# Patient Record
Sex: Female | Born: 1960
Health system: Southern US, Community
[De-identification: ages and names within clinical notes are randomized; demographics above are authoritative.]

## PROBLEM LIST (undated history)

## (undated) DIAGNOSIS — C801 Malignant (primary) neoplasm, unspecified: Secondary | ICD-10-CM

## (undated) DIAGNOSIS — G43909 Migraine, unspecified, not intractable, without status migrainosus: Secondary | ICD-10-CM

## (undated) DIAGNOSIS — C541 Malignant neoplasm of endometrium: Secondary | ICD-10-CM

## (undated) DIAGNOSIS — G4730 Sleep apnea, unspecified: Secondary | ICD-10-CM

## (undated) DIAGNOSIS — K219 Gastro-esophageal reflux disease without esophagitis: Secondary | ICD-10-CM

## (undated) HISTORY — DX: Sleep apnea, unspecified: G47.30

## (undated) HISTORY — PX: MYOMECTOMY: SHX85

## (undated) HISTORY — PX: ABDOMINAL HYSTERECTOMY: SHX81

## (undated) HISTORY — DX: Migraine, unspecified, not intractable, without status migrainosus: G43.909

## (undated) HISTORY — DX: Malignant neoplasm of endometrium: C54.1

## (undated) HISTORY — PX: INGUINAL HERNIA REPAIR: SUR1180

## (undated) HISTORY — PX: CERVICAL POLYPECTOMY: SHX88

---

## 2004-09-13 ENCOUNTER — Ambulatory Visit: Payer: Self-pay | Admitting: Surgery

## 2005-01-01 ENCOUNTER — Ambulatory Visit: Payer: Self-pay | Admitting: Surgery

## 2006-09-09 ENCOUNTER — Ambulatory Visit: Payer: Self-pay | Admitting: Internal Medicine

## 2006-12-18 ENCOUNTER — Ambulatory Visit: Payer: Self-pay | Admitting: Family Medicine

## 2009-03-20 ENCOUNTER — Ambulatory Visit: Payer: Self-pay

## 2009-03-22 ENCOUNTER — Ambulatory Visit: Payer: Self-pay

## 2010-05-16 ENCOUNTER — Ambulatory Visit: Payer: Self-pay | Admitting: Family Medicine

## 2010-06-05 ENCOUNTER — Ambulatory Visit: Payer: Self-pay | Admitting: Family Medicine

## 2011-08-06 ENCOUNTER — Emergency Department: Payer: Self-pay | Admitting: *Deleted

## 2011-08-06 LAB — COMPREHENSIVE METABOLIC PANEL
Albumin: 3.6 g/dL (ref 3.4–5.0)
Alkaline Phosphatase: 52 U/L (ref 50–136)
Anion Gap: 8 (ref 7–16)
BUN: 21 mg/dL — ABNORMAL HIGH (ref 7–18)
Calcium, Total: 8.6 mg/dL (ref 8.5–10.1)
Chloride: 105 mmol/L (ref 98–107)
Co2: 26 mmol/L (ref 21–32)
Creatinine: 0.82 mg/dL (ref 0.60–1.30)
EGFR (African American): >60
EGFR (Non-African Amer.): >60
Osmolality: 281 (ref 275–301)
Potassium: 3.7 mmol/L (ref 3.5–5.1)
SGOT(AST): 18 U/L (ref 15–37)
SGPT (ALT): 16 U/L
Sodium: 139 mmol/L (ref 136–145)
Total Protein: 6.9 g/dL (ref 6.4–8.2)

## 2011-08-06 LAB — CBC
HCT: 45 % (ref 35.0–47.0)
MCHC: 33.1 g/dL (ref 32.0–36.0)
MCV: 91 fL (ref 80–100)
Platelet: 232 10*3/uL (ref 150–440)
WBC: 7.7 10*3/uL (ref 3.6–11.0)

## 2011-08-06 LAB — PROTIME-INR: Prothrombin Time: 12.8 secs (ref 11.5–14.7)

## 2011-08-06 LAB — LIPASE, BLOOD: Lipase: 70 U/L — ABNORMAL LOW (ref 73–393)

## 2011-08-06 LAB — CK TOTAL AND CKMB (NOT AT ARMC): CK, Total: 38 U/L (ref 21–215)

## 2014-08-24 ENCOUNTER — Encounter: Payer: Self-pay | Admitting: Obstetrics and Gynecology

## 2014-09-06 ENCOUNTER — Encounter (INDEPENDENT_AMBULATORY_CARE_PROVIDER_SITE_OTHER): Payer: Self-pay

## 2014-09-06 ENCOUNTER — Inpatient Hospital Stay
Payer: Federal, State, Local not specified - PPO | Attending: Obstetrics and Gynecology | Admitting: Obstetrics and Gynecology

## 2014-09-06 VITALS — BP 129/89 | HR 75 | Temp 97.4°F | Resp 18 | Ht 67.0 in | Wt 203.5 lb

## 2014-09-06 DIAGNOSIS — N8502 Endometrial intraepithelial neoplasia [EIN]: Secondary | ICD-10-CM | POA: Insufficient documentation

## 2014-09-06 DIAGNOSIS — F17210 Nicotine dependence, cigarettes, uncomplicated: Secondary | ICD-10-CM

## 2014-09-06 DIAGNOSIS — G4730 Sleep apnea, unspecified: Secondary | ICD-10-CM

## 2014-09-06 NOTE — Progress Notes (Signed)
Gynecologic Oncology Consult Visit   Referring Provider: Dr Rivka Barbara  Chief Concern: Atypical endometrial hyperplasia, possible endometrial cancer  Subjective:  Grace Sanchez is a 54 y.o. G85P2 female who is seen in consultation from Dr. Laurey Morale for atypical endometrial hyperplasia, possible endometrial cancer.  Menopause 2 years ago.  2010 D&C for PMB showed endometrial polyps and proliferative endometrium.  PAP normal 01/10/14 PMB, PAP ASCUS with negative HPV, endocervical polyp removed that showed complex hyperplasia without atypia 01/24/14 endo bx showed inactive endometrium, benign endocervical polyp 08/17/14 endo bx showed atypical endometrial hyperplasia, EIN at least.  She is otherwise healthy and has no other complaints.  Problem List: Patient Active Problem List   Diagnosis Date Noted  . Endometrial intraepithelial neoplasia (EIN) 09/06/2014    Past Medical History: Past Medical History  Diagnosis Date  . Migraine   . Sleep apnea     Past Surgical History: Past Surgical History  Procedure Laterality Date  . Inguinal hernia repair    . Cervical polypectomy       Family History: Family History  Problem Relation Age of Onset  . Deep vein thrombosis Mother   . Heart attack Mother   . Diabetes Mother   . Diabetes Brother   . Diabetes Sister   . Diabetes Maternal Aunt     Social History: History   Social History  . Marital Status: Married    Spouse Name: N/A  . Number of Children: N/A  . Years of Education: N/A   Occupational History  . Not on file.   Social History Main Topics  . Smoking status: Light Tobacco Smoker -- 0.25 packs/day for 4 years    Types: Cigarettes  . Smokeless tobacco: Never Used  . Alcohol Use: 0.6 oz/week    1 Glasses of wine per week     Comment: glass a day  . Drug Use: No  . Sexual Activity: Not on file   Other Topics Concern  . Not on file   Social History Narrative  . No narrative on file     Allergies: Allergies  Allergen Reactions  . Codeine Other (See Comments)    Vasovagal response- hypotensive    Current Medications: No current outpatient prescriptions on file.   No current facility-administered medications for this visit.    Review of Systems General: negative for, fevers, chills, fatigue, changes in sleep, changes in weight or appetite Skin: negative for changes in color, texture, moles or lesions Eyes: negative for, changes in vision, pain, diplopia HEENT: negative for, change in hearing, pain, discharge, tinnitus, vertigo, voice changes, sore throat, neck masses Breasts: negative for breast lumps Pulmonary: negative for, dyspnea, orthopnea, productive cough Cardiac: negative for, palpitations, syncope, pain, discomfort, pressure Gastrointestinal: negative for, dysphagia, nausea, vomiting, jaundice, pain, constipation, diarrhea, hematemesis, hematochezia Genitourinary/Sexual: negative for, dysuria, discharge, hesitancy, nocturia, retention, stones, infections, STD's, incontinence Ob/Gyn: negative for pain Musculoskeletal: negative for, pain, stiffness, swelling, range of motion limitation Hematology: negative for, easy bruising, bleeding Neurologic/Psych: negative for, headaches, seizures, paralysis, weakness, tremor, change in gait, change in sensation, mood swings, depression, anxiety, change in memory  Objective:  Physical Examination:  Filed Vitals:   09/06/14 1408  BP: 129/89  Pulse: 75  Temp: 97.4 F (36.3 C)  TempSrc: Tympanic  Resp: 18  Height: 5\' 7"  (1.702 m)  Weight: 203 lb 7.8 oz (92.301 kg)    ECOG Performance Status: 0 - Asymptomatic  General appearance: alert, cooperative and appears stated age HEENT: membranes moist, PERRL, EOMI, sclera  clear Neck: supple,"no thyroid enlargement or cervical adenopathy Lymph node survey: normal Cardiovascular: regular rate and rhythm, without murmurs, rubs or gallops Respiratory: clear to  auscultation Abdomen: no palpable masses, no hernias, soft", nontender, nondistended, without hepatosplenomegaly Back: inspection of back is normal Extremities: no lower extremity edema Skin exam: normal coloration and turgor, no rashes, no suspicious skin lesions noted. Neurological exam reveals alert, oriented, normal speech, no focal findings or movement disorder noted.  Pelvic: Vulva: normal appearing vulva with no masses, tenderness or lesions; Vagina: normal vagina, no discharge, exudate, lesion, or erythema; Adnexa: normal adnexa in size, nontender and no masses; Uterus: normal size, shape, consistency and nontender; Cervix: no lesions; Bimanual: no masses or tenderness, Rectal: confirms bimanual   Assessment:  Grace Sanchez is a 54 y.o. female diagnosed with endometrial bx showing atypical endometrial hyperplasia, EIN at least.   Plan:   Problem List Items Addressed This Visit    Endometrial intraepithelial neoplasia (EIN) - Primary     We discussed options for management including TLH/BSO with sentinel lymph node mapping and biopsy in the event that cancer is present.  We will schedule the surgery for 09/27/14 with Dr Glennon Mac or Ilda Basset from [facility].  The risks of surgery were discussed in detail and she understands these to include infection; wound separation; hernia; vaginal cuff separation, injury to adjacent organs such as bowel, bladder, blood vessels, ureters and nerves; bleeding which may require blood transfusion; anesthesia risk; thromboembolic events; possible death; unforeseen complications; possible need for re-exploration; medical complications such as heart attack, stroke, pleural effusion and pneumonia; and, if staging performed the risk of lymphedema and lymphocyst.  The patient will receive DVT and antibiotic prophylaxis as indicated.  She voiced a clear understanding.  She had the opportunity to ask questions and written informed consent was obtained today.     The patient's diagnosis, an outline of the further diagnostic and laboratory studies which will be required, the recommendation, and alternatives were discussed.  All questions were answered to the patient's satisfaction.  Mellody Drown, MD  CC:  Rosina Lowenstein, MD [address] [place], [place] [address] [phone]

## 2014-09-11 ENCOUNTER — Telehealth: Payer: Self-pay | Admitting: *Deleted

## 2014-09-11 NOTE — Telephone Encounter (Signed)
Spoke with patient. Pre-admit testing apt given to patient. Apt on September 18, 2014 at 9 am. Instructed patient to report to [id number] in the [facility] for her preoperative appointment.  Patient's surgery case has been posted for 09/27/14 with Dr. Fransisca Connors and Dr. Glennon Mac.

## 2014-09-18 ENCOUNTER — Encounter
Admission: RE | Admit: 2014-09-18 | Discharge: 2014-09-18 | Disposition: A | Discharge status: Home / Self Care | Payer: Federal, State, Local not specified - PPO | Source: Ambulatory Visit | Attending: Obstetrics and Gynecology | Admitting: Obstetrics and Gynecology

## 2014-09-18 DIAGNOSIS — N8502 Endometrial intraepithelial neoplasia [EIN]: Secondary | ICD-10-CM | POA: Diagnosis not present

## 2014-09-18 DIAGNOSIS — Z01812 Encounter for preprocedural laboratory examination: Secondary | ICD-10-CM | POA: Insufficient documentation

## 2014-09-18 HISTORY — DX: Gastro-esophageal reflux disease without esophagitis: K21.9

## 2014-09-18 LAB — CBC WITH DIFFERENTIAL/PLATELET
BASOS ABS: 0.1 10*3/uL (ref 0–0.1)
Basophils Relative: 1 %
Eosinophils Absolute: 0.1 10*3/uL (ref 0–0.7)
Eosinophils Relative: 1 %
HCT: 45.5 % (ref 35.0–47.0)
Hemoglobin: 15.2 g/dL (ref 12.0–16.0)
LYMPHS PCT: 23 %
Lymphs Abs: 1.9 10*3/uL (ref 1.0–3.6)
MCH: 30.3 pg (ref 26.0–34.0)
MCHC: 33.4 g/dL (ref 32.0–36.0)
MCV: 90.5 fL (ref 80.0–100.0)
Monocytes Absolute: 0.5 10*3/uL (ref 0.2–0.9)
Monocytes Relative: 7 %
Neutro Abs: 5.6 10*3/uL (ref 1.4–6.5)
Neutrophils Relative %: 68 %
Platelets: 268 10*3/uL (ref 150–440)
RBC: 5.03 MIL/uL (ref 3.80–5.20)
RDW: 13.2 % (ref 11.5–14.5)
WBC: 8.2 10*3/uL (ref 3.6–11.0)

## 2014-09-18 LAB — BASIC METABOLIC PANEL
Anion gap: 6 (ref 5–15)
BUN: 20 mg/dL (ref 6–20)
CO2: 26 mmol/L (ref 22–32)
Calcium: 8.9 mg/dL (ref 8.9–10.3)
Chloride: 108 mmol/L (ref 101–111)
Creatinine, Ser: 0.84 mg/dL (ref 0.44–1.00)
GFR calc Af Amer: >60 mL/min (ref >60)
GFR calc non Af Amer: >60 mL/min (ref >60)
Glucose, Bld: 101 mg/dL — ABNORMAL HIGH (ref 65–99)
Potassium: 3.4 mmol/L — ABNORMAL LOW (ref 3.5–5.1)
Sodium: 140 mmol/L (ref 135–145)

## 2014-09-18 LAB — URINALYSIS COMPLETE WITH MICROSCOPIC (ARMC ONLY)
BACTERIA UA: NONE SEEN
BILIRUBIN URINE: NEGATIVE
GLUCOSE, UA: NEGATIVE mg/dL
Hgb urine dipstick: NEGATIVE
Ketones, ur: NEGATIVE mg/dL
LEUKOCYTES UA: NEGATIVE
NITRITE: NEGATIVE
Protein, ur: NEGATIVE mg/dL
Specific Gravity, Urine: 1.013 (ref 1.005–1.030)
Squamous Epithelial / LPF: NONE SEEN
pH: 5 (ref 5.0–8.0)

## 2014-09-18 LAB — ABO/RH: ABO/RH(D): A POS

## 2014-09-18 LAB — TYPE AND SCREEN
ABO/RH(D): A POS
ANTIBODY SCREEN: NEGATIVE

## 2014-09-18 LAB — APTT: aPTT: 28 seconds (ref 24–36)

## 2014-09-18 LAB — PROTIME-INR
INR: 0.9
PROTHROMBIN TIME: 12.4 s (ref 11.4–15.0)

## 2014-09-18 NOTE — Patient Instructions (Signed)
  Your procedure is scheduled GY:IRSW 22, 2016 Wednesday) Report to Day Surgery. To find out your arrival time please call [phone] between 1PM - 3PM on September 26, 2014 (Tuesday).  Remember: Instructions that are not followed completely may result in serious medical risk, up to and including death, or upon the discretion of your surgeon and anesthesiologist your surgery may need to be rescheduled.    __x__ 1. Do not eat food or drink liquids after midnight. No gum chewing or hard candies.     __x__ 2. No Alcohol for 24 hours before or after surgery.   ____ 3. Bring all medications with you on the day of surgery if instructed.    __x__ 4. Notify your doctor if there is any change in your medical condition     (cold, fever, infections).     Do not wear jewelry, make-up, hairpins, clips or nail polish.  Do not wear lotions, powders, or perfumes. You may wear deodorant.  Do not shave 48 hours prior to surgery. Men may shave face and neck.  Do not bring valuables to the hospital.    [facility] is not responsible for any belongings or valuables.               Contacts, dentures or bridgework may not be worn into surgery.  Leave your suitcase in the car. After surgery it may be brought to your room.  For patients admitted to the hospital, discharge time is determined by your                treatment team.   Patients discharged the day of surgery will not be allowed to drive home.   Please read over the following fact sheets that you were given:   Surgical Site Infection Prevention   ____ Take these medicines the morning of surgery with A SIP OF WATER:    1.   2.   3.   4.  5.  6.  __x__ Fleet Enema (as directed)   __x_ Use CHG Soap as directed  ____ Use inhalers on the day of surgery  ____ Stop metformin 2 days prior to surgery    ____ Take 1/2 of usual insulin dose the night before surgery and none on the morning of surgery.   ____ Stop Coumadin/Plavix/aspirin on    ____ Stop Anti-inflammatories on    ____ Stop supplements until after surgery.    ____ Bring C-Pap to the hospital.

## 2014-09-27 ENCOUNTER — Ambulatory Visit: Payer: Federal, State, Local not specified - PPO | Admitting: Anesthesiology

## 2014-09-27 ENCOUNTER — Encounter
Admission: RE | Disposition: A | Discharge status: Home / Self Care | Payer: Self-pay | Source: Ambulatory Visit | Attending: Obstetrics and Gynecology

## 2014-09-27 ENCOUNTER — Encounter: Payer: Self-pay | Admitting: *Deleted

## 2014-09-27 ENCOUNTER — Observation Stay
Admission: RE | Admit: 2014-09-27 | Discharge: 2014-09-28 | Disposition: A | Discharge status: Home / Self Care | Payer: Federal, State, Local not specified - PPO | Source: Ambulatory Visit | Attending: Obstetrics and Gynecology | Admitting: Obstetrics and Gynecology

## 2014-09-27 DIAGNOSIS — Z8249 Family history of ischemic heart disease and other diseases of the circulatory system: Secondary | ICD-10-CM | POA: Diagnosis not present

## 2014-09-27 DIAGNOSIS — N9489 Other specified conditions associated with female genital organs and menstrual cycle: Secondary | ICD-10-CM | POA: Insufficient documentation

## 2014-09-27 DIAGNOSIS — D398 Neoplasm of uncertain behavior of other specified female genital organs: Secondary | ICD-10-CM | POA: Insufficient documentation

## 2014-09-27 DIAGNOSIS — N838 Other noninflammatory disorders of ovary, fallopian tube and broad ligament: Secondary | ICD-10-CM | POA: Diagnosis not present

## 2014-09-27 DIAGNOSIS — N950 Postmenopausal bleeding: Secondary | ICD-10-CM | POA: Insufficient documentation

## 2014-09-27 DIAGNOSIS — C541 Malignant neoplasm of endometrium: Secondary | ICD-10-CM | POA: Diagnosis not present

## 2014-09-27 DIAGNOSIS — G4730 Sleep apnea, unspecified: Secondary | ICD-10-CM | POA: Diagnosis not present

## 2014-09-27 DIAGNOSIS — N8502 Endometrial intraepithelial neoplasia [EIN]: Secondary | ICD-10-CM | POA: Diagnosis present

## 2014-09-27 DIAGNOSIS — K219 Gastro-esophageal reflux disease without esophagitis: Secondary | ICD-10-CM | POA: Insufficient documentation

## 2014-09-27 DIAGNOSIS — Z833 Family history of diabetes mellitus: Secondary | ICD-10-CM | POA: Diagnosis not present

## 2014-09-27 DIAGNOSIS — N800 Endometriosis of uterus: Secondary | ICD-10-CM | POA: Insufficient documentation

## 2014-09-27 DIAGNOSIS — N840 Polyp of corpus uteri: Secondary | ICD-10-CM | POA: Diagnosis not present

## 2014-09-27 DIAGNOSIS — Z8489 Family history of other specified conditions: Secondary | ICD-10-CM | POA: Diagnosis not present

## 2014-09-27 DIAGNOSIS — Z87891 Personal history of nicotine dependence: Secondary | ICD-10-CM | POA: Insufficient documentation

## 2014-09-27 DIAGNOSIS — Z885 Allergy status to narcotic agent status: Secondary | ICD-10-CM | POA: Diagnosis not present

## 2014-09-27 DIAGNOSIS — D259 Leiomyoma of uterus, unspecified: Secondary | ICD-10-CM | POA: Diagnosis not present

## 2014-09-27 DIAGNOSIS — Z90710 Acquired absence of both cervix and uterus: Secondary | ICD-10-CM | POA: Diagnosis present

## 2014-09-27 DIAGNOSIS — Z9889 Other specified postprocedural states: Secondary | ICD-10-CM | POA: Insufficient documentation

## 2014-09-27 HISTORY — PX: LYMPH NODE BIOPSY: SHX201

## 2014-09-27 HISTORY — PX: PELVIC LYMPH NODE DISSECTION: SHX6543

## 2014-09-27 HISTORY — PX: LAPAROTOMY: SHX154

## 2014-09-27 HISTORY — PX: LAPAROSCOPIC HYSTERECTOMY: SHX1926

## 2014-09-27 SURGERY — HYSTERECTOMY, TOTAL, LAPAROSCOPIC
Anesthesia: General | Wound class: Clean Contaminated

## 2014-09-27 MED ORDER — ONDANSETRON HCL 4 MG/2ML IJ SOLN: 4.0000 mg | Freq: Once | INTRAMUSCULAR | Status: DC | PRN

## 2014-09-27 MED ORDER — DEXAMETHASONE SODIUM PHOSPHATE 4 MG/ML IJ SOLN
INTRAMUSCULAR | Status: DC | PRN
  Administered 2014-09-27: 10 mg via INTRAVENOUS

## 2014-09-27 MED ORDER — ONDANSETRON 4 MG PO TBDP: 4.0000 mg | ORAL_TABLET | Freq: Three times a day (TID) | ORAL | Status: DC | PRN

## 2014-09-27 MED ORDER — METHYLENE BLUE 1 % INJ SOLN
INTRAMUSCULAR | Status: AC
  Filled 2014-09-27: qty 10

## 2014-09-27 MED ORDER — FAMOTIDINE 20 MG PO TABS
20.0000 mg | ORAL_TABLET | Freq: Once | ORAL | Status: AC
  Administered 2014-09-27: 20 mg via ORAL

## 2014-09-27 MED ORDER — CEFAZOLIN SODIUM-DEXTROSE 2-3 GM-% IV SOLR
INTRAVENOUS | Status: AC
  Filled 2014-09-27: qty 50

## 2014-09-27 MED ORDER — DOCUSATE SODIUM 100 MG PO CAPS
100.0000 mg | ORAL_CAPSULE | Freq: Two times a day (BID) | ORAL | Status: DC
  Administered 2014-09-27 – 2014-09-28 (×3): 100 mg via ORAL
  Filled 2014-09-27 (×3): qty 1

## 2014-09-27 MED ORDER — FENTANYL CITRATE (PF) 100 MCG/2ML IJ SOLN
25.0000 ug | INTRAMUSCULAR | Status: AC | PRN
  Administered 2014-09-27 (×6): 25 ug via INTRAVENOUS

## 2014-09-27 MED ORDER — ACETAMINOPHEN 10 MG/ML IV SOLN
INTRAVENOUS | Status: AC
  Filled 2014-09-27: qty 100

## 2014-09-27 MED ORDER — MIDAZOLAM HCL 2 MG/2ML IJ SOLN
INTRAMUSCULAR | Status: DC | PRN
  Administered 2014-09-27: 2 mg via INTRAVENOUS

## 2014-09-27 MED ORDER — NEOSTIGMINE METHYLSULFATE 10 MG/10ML IV SOLN
INTRAVENOUS | Status: DC | PRN
  Administered 2014-09-27: 4 mg via INTRAVENOUS

## 2014-09-27 MED ORDER — SIMETHICONE 80 MG PO CHEW: 80.0000 mg | CHEWABLE_TABLET | Freq: Four times a day (QID) | ORAL | Status: DC | PRN

## 2014-09-27 MED ORDER — ACETAMINOPHEN 10 MG/ML IV SOLN
INTRAVENOUS | Status: DC | PRN
Start: 2014-09-27 — End: 2014-09-27
  Administered 2014-09-27: 1000 mg via INTRAVENOUS

## 2014-09-27 MED ORDER — MENTHOL 3 MG MT LOZG: 1.0000 | LOZENGE | OROMUCOSAL | Status: DC | PRN

## 2014-09-27 MED ORDER — FAMOTIDINE 20 MG PO TABS
ORAL_TABLET | ORAL | Status: AC
  Administered 2014-09-27: 20 mg via ORAL
  Filled 2014-09-27: qty 1

## 2014-09-27 MED ORDER — THROMBIN 5000 UNITS EX SOLR
CUTANEOUS | Status: DC | PRN
  Administered 2014-09-27: 5000 [IU] via TOPICAL

## 2014-09-27 MED ORDER — THROMBIN 5000 UNITS EX SOLR
CUTANEOUS | Status: AC
  Filled 2014-09-27: qty 5000

## 2014-09-27 MED ORDER — OXYCODONE-ACETAMINOPHEN 5-325 MG PO TABS: 2.0000 | ORAL_TABLET | ORAL | Status: DC | PRN

## 2014-09-27 MED ORDER — ROCURONIUM BROMIDE 100 MG/10ML IV SOLN
INTRAVENOUS | Status: DC | PRN
  Administered 2014-09-27: 20 mg via INTRAVENOUS
  Administered 2014-09-27: 50 mg via INTRAVENOUS

## 2014-09-27 MED ORDER — FLEET ENEMA 7-19 GM/118ML RE ENEM: 1.0000 | ENEMA | Freq: Once | RECTAL | Status: DC

## 2014-09-27 MED ORDER — LACTATED RINGERS IV SOLN
INTRAVENOUS | Status: DC
  Administered 2014-09-27 (×3): via INTRAVENOUS

## 2014-09-27 MED ORDER — SODIUM CHLORIDE 0.9 % IJ SOLN
INTRAMUSCULAR | Status: AC
  Filled 2014-09-27: qty 10

## 2014-09-27 MED ORDER — IBUPROFEN 600 MG PO TABS
600.0000 mg | ORAL_TABLET | Freq: Four times a day (QID) | ORAL | Status: DC | PRN
  Administered 2014-09-27 – 2014-09-28 (×3): 600 mg via ORAL
  Filled 2014-09-27 (×3): qty 1

## 2014-09-27 MED ORDER — ONDANSETRON HCL 4 MG/2ML IJ SOLN
INTRAMUSCULAR | Status: DC | PRN
  Administered 2014-09-27: 4 mg via INTRAVENOUS

## 2014-09-27 MED ORDER — PROPOFOL 10 MG/ML IV BOLUS
INTRAVENOUS | Status: DC | PRN
  Administered 2014-09-27: 160 mg via INTRAVENOUS

## 2014-09-27 MED ORDER — HYDROMORPHONE HCL 1 MG/ML IJ SOLN
0.5000 mg | INTRAMUSCULAR | Status: DC | PRN
  Administered 2014-09-27 (×2): 0.5 mg via INTRAVENOUS

## 2014-09-27 MED ORDER — PHENYLEPHRINE HCL 10 MG/ML IJ SOLN
INTRAMUSCULAR | Status: DC | PRN
  Administered 2014-09-27 (×3): 100 ug via INTRAVENOUS

## 2014-09-27 MED ORDER — CEFAZOLIN SODIUM-DEXTROSE 2-3 GM-% IV SOLR
2.0000 g | INTRAVENOUS | Status: AC
  Administered 2014-09-27: 2 g via INTRAVENOUS

## 2014-09-27 MED ORDER — ONDANSETRON HCL 4 MG PO TABS: 4.0000 mg | ORAL_TABLET | Freq: Four times a day (QID) | ORAL | Status: DC | PRN

## 2014-09-27 MED ORDER — LACTATED RINGERS IV SOLN
INTRAVENOUS | Status: DC
  Administered 2014-09-27: 15:00:00 via INTRAVENOUS

## 2014-09-27 MED ORDER — METHYLENE BLUE 1 % INJ SOLN
INTRAMUSCULAR | Status: DC | PRN
Start: 2014-09-27 — End: 2014-09-27
  Administered 2014-09-27: 4 mL via SUBMUCOSAL

## 2014-09-27 MED ORDER — IBUPROFEN 600 MG PO TABS: 600.0000 mg | ORAL_TABLET | Freq: Four times a day (QID) | ORAL | Status: DC | PRN

## 2014-09-27 MED ORDER — HYDROMORPHONE HCL 1 MG/ML IJ SOLN: 1.0000 mg | INTRAMUSCULAR | Status: DC | PRN

## 2014-09-27 MED ORDER — FENTANYL CITRATE (PF) 100 MCG/2ML IJ SOLN
INTRAMUSCULAR | Status: AC
  Filled 2014-09-27: qty 2

## 2014-09-27 MED ORDER — FENTANYL CITRATE (PF) 100 MCG/2ML IJ SOLN
INTRAMUSCULAR | Status: DC | PRN
  Administered 2014-09-27 (×2): 100 ug via INTRAVENOUS
  Administered 2014-09-27: 50 ug via INTRAVENOUS
  Administered 2014-09-27: 100 ug via INTRAVENOUS

## 2014-09-27 MED ORDER — GLYCOPYRROLATE 0.2 MG/ML IJ SOLN
INTRAMUSCULAR | Status: AC
Start: 2014-09-27 — End: 2014-09-27
  Filled 2014-09-27: qty 1

## 2014-09-27 MED ORDER — GLYCOPYRROLATE 0.2 MG/ML IJ SOLN
INTRAMUSCULAR | Status: DC | PRN
  Administered 2014-09-27: .6 mg via INTRAVENOUS
  Administered 2014-09-27 (×2): 0.2 mg via INTRAVENOUS

## 2014-09-27 MED ORDER — ONDANSETRON HCL 4 MG/2ML IJ SOLN: 4.0000 mg | Freq: Four times a day (QID) | INTRAMUSCULAR | Status: DC | PRN

## 2014-09-27 MED ORDER — OXYCODONE-ACETAMINOPHEN 5-325 MG PO TABS
1.0000 | ORAL_TABLET | ORAL | Status: DC | PRN
  Administered 2014-09-27 (×2): 1 via ORAL
  Filled 2014-09-27 (×2): qty 1

## 2014-09-27 MED ORDER — LIDOCAINE HCL (CARDIAC) 20 MG/ML IV SOLN
INTRAVENOUS | Status: DC | PRN
  Administered 2014-09-27: 50 mg via INTRAVENOUS

## 2014-09-27 MED ORDER — HYDROMORPHONE HCL 1 MG/ML IJ SOLN
INTRAMUSCULAR | Status: AC
  Filled 2014-09-27: qty 1

## 2014-09-27 SURGICAL SUPPLY — 91 items
APPLICATOR SURGIFLO (MISCELLANEOUS) ×2 IMPLANT
BAG URO DRAIN 2000ML W/SPOUT (MISCELLANEOUS) ×2 IMPLANT
BLADE SURG 15 STRL SS SAFETY (BLADE) IMPLANT
BLADE SURG SZ11 CARB STEEL (BLADE) ×2 IMPLANT
CANISTER SUCT 1200ML W/VALVE (MISCELLANEOUS) ×2 IMPLANT
CANNULA DILATOR 12 W/SLV (CANNULA) ×2 IMPLANT
CANNULA DILATOR 5 W/SLV (CANNULA) ×2 IMPLANT
CATH FOL 2WAY LX 16X5 (CATHETERS) ×2 IMPLANT
CATH TRAY 16F METER LATEX (MISCELLANEOUS) ×2 IMPLANT
CHLORAPREP W/TINT 26ML (MISCELLANEOUS) ×2 IMPLANT
CNTNR SPEC 2.5X3XGRAD LEK (MISCELLANEOUS) ×1
CONT SPEC 4OZ STER OR WHT (MISCELLANEOUS) ×1
CONTAINER SPEC 2.5X3XGRAD LEK (MISCELLANEOUS) ×1 IMPLANT
CORD MONOPOLAR M/FML 12FT (MISCELLANEOUS) ×2 IMPLANT
DEFOGGER SCOPE WARMER CLEARIFY (MISCELLANEOUS) ×2 IMPLANT
DEVICE SUTURE ENDOST 10MM (ENDOMECHANICALS) ×2 IMPLANT
DRAPE LAP W/FLUID (DRAPES) IMPLANT
DRAPE LAPAROTOMY 100X77 ABD (DRAPES) IMPLANT
DRAPE LAPAROTOMY TRNSV 106X77 (MISCELLANEOUS) IMPLANT
DRAPE PERI LITHO V/GYN (MISCELLANEOUS) IMPLANT
DRAPE SURG 17X23 STRL (DRAPES) IMPLANT
DRAPE UNDER BUTTOCK W/FLU (DRAPES) IMPLANT
DRSG TEGADERM 2-3/8X2-3/4 SM (GAUZE/BANDAGES/DRESSINGS) IMPLANT
DRSG TELFA 3X8 NADH (GAUZE/BANDAGES/DRESSINGS) IMPLANT
ELECT BLADE 6 FLAT ULTRCLN (ELECTRODE) IMPLANT
ELECT CAUTERY BLADE 6.4 (BLADE) IMPLANT
GAUZE SPONGE 4X4 12PLY STRL (GAUZE/BANDAGES/DRESSINGS) IMPLANT
GAUZE SPONGE NON-WVN 2X2 STRL (MISCELLANEOUS) ×1 IMPLANT
GLOVE BIO SURGEON STRL SZ8 (GLOVE) ×8 IMPLANT
GLOVE INDICATOR 8.0 STRL GRN (GLOVE) ×4 IMPLANT
GOWN STRL REUS W/ TWL LRG LVL3 (GOWN DISPOSABLE) ×2 IMPLANT
GOWN STRL REUS W/ TWL XL LVL3 (GOWN DISPOSABLE) ×1 IMPLANT
GOWN STRL REUS W/TWL LRG LVL3 (GOWN DISPOSABLE) ×2
GOWN STRL REUS W/TWL XL LVL3 (GOWN DISPOSABLE) ×1
IRRIGATION STRYKERFLOW (MISCELLANEOUS) ×1 IMPLANT
IRRIGATOR STRYKERFLOW (MISCELLANEOUS) ×2
IV LACTATED RINGERS 1000ML (IV SOLUTION) ×2 IMPLANT
KIT RM TURNOVER CYSTO AR (KITS) ×2 IMPLANT
LABEL OR SOLS (LABEL) ×2 IMPLANT
LIGASURE BLUNT 5MM 37CM (INSTRUMENTS) ×2 IMPLANT
LIGASURE IMPACT 36 18CM CVD LR (INSTRUMENTS) IMPLANT
LIQUID BAND (GAUZE/BANDAGES/DRESSINGS) ×2 IMPLANT
MANIPULATOR VCARE LG CRV RETR (MISCELLANEOUS) IMPLANT
MANIPULATOR VCARE STD CRV RETR (MISCELLANEOUS) ×2 IMPLANT
NDL INSUFF 14G 150MM VS150000 (NEEDLE) ×2 IMPLANT
NDL INSUFF ACCESS 14 VERSASTEP (NEEDLE) ×2 IMPLANT
NEEDLE SPNL 22GX3.5 QUINCKE BK (NEEDLE) ×2 IMPLANT
NEEDLE SPNL 22GX5 LNG QUINC BK (NEEDLE) ×2 IMPLANT
NEEDLE VERESS 14GA 120MM (NEEDLE) ×2 IMPLANT
NS IRRIG 1000ML POUR BTL (IV SOLUTION) ×2 IMPLANT
NS IRRIG 500ML POUR BTL (IV SOLUTION) ×2 IMPLANT
OCCLUDER COLPOPNEUMO (BALLOONS) ×2 IMPLANT
PACK BASIN MAJOR ARMC (MISCELLANEOUS) IMPLANT
PACK GYN LAPAROSCOPIC (MISCELLANEOUS) ×2 IMPLANT
PAD GROUND ADULT SPLIT (MISCELLANEOUS) ×2 IMPLANT
PAD OB MATERNITY 4.3X12.25 (PERSONAL CARE ITEMS) ×2 IMPLANT
PAD PREP 24X41 OB/GYN DISP (PERSONAL CARE ITEMS) ×2 IMPLANT
PAD TRENDELENBURG OR TABLE (MISCELLANEOUS) ×2 IMPLANT
SCISSORS METZENBAUM CVD 33 (INSTRUMENTS) ×2 IMPLANT
SET CYSTO W/LG BORE CLAMP LF (SET/KITS/TRAYS/PACK) ×2 IMPLANT
SHEARS ENDO 5MM LNG  ASK BEFOR (MISCELLANEOUS) ×1
SHEARS ENDO 5MM LNG ASK BEFOR (MISCELLANEOUS) ×1 IMPLANT
SLEEVE ENDOPATH XCEL 5M (ENDOMECHANICALS) IMPLANT
SPOGE SURGIFLO 8M (HEMOSTASIS) ×1
SPONGE LAP 18X18 5 PK (GAUZE/BANDAGES/DRESSINGS) IMPLANT
SPONGE LAP 4X18 5PK (MISCELLANEOUS) IMPLANT
SPONGE SURGIFLO 8M (HEMOSTASIS) ×1 IMPLANT
SPONGE VERSALON 2X2 STRL (MISCELLANEOUS) ×1
STAPLER SKIN PROX 35W (STAPLE) ×2 IMPLANT
SUT ENDO VLOC 180-0-8IN (SUTURE) ×4 IMPLANT
SUT MAXON ABS #0 GS21 30IN (SUTURE) ×4 IMPLANT
SUT PDS AB 1 TP1 96 (SUTURE) ×4 IMPLANT
SUT PLAIN 2 0 XLH (SUTURE) ×2 IMPLANT
SUT VIC AB 0 CT1 27 (SUTURE) ×4
SUT VIC AB 0 CT1 27XCR 8 STRN (SUTURE) ×4 IMPLANT
SUT VIC AB 0 CT1 36 (SUTURE) ×2 IMPLANT
SUT VIC AB 2-0 UR6 27 (SUTURE) IMPLANT
SUT VICRYL AB 3-0 FS1 BRD 27IN (SUTURE) ×2 IMPLANT
SYR 10ML SLIP (SYRINGE) ×2 IMPLANT
SYR 3ML LL SCALE MARK (SYRINGE) ×4 IMPLANT
SYR 50ML LL SCALE MARK (SYRINGE) ×2 IMPLANT
SYR BULB IRRIG 60ML STRL (SYRINGE) ×2 IMPLANT
SYRINGE 10CC LL (SYRINGE) ×2 IMPLANT
TOWEL OR 17X26 4PK STRL BLUE (TOWEL DISPOSABLE) ×2 IMPLANT
TRAY PREP VAG/GEN (MISCELLANEOUS) IMPLANT
TROCAR 130MM GELPORT  DAV (MISCELLANEOUS) ×2 IMPLANT
TROCAR BLUNT TIP 12MM OMST12BT (TROCAR) ×2 IMPLANT
TROCAR ENDO BLADELESS 11MM (ENDOMECHANICALS) IMPLANT
TROCAR VERSASTEP 12M LG PL (TROCAR) ×2 IMPLANT
TROCAR XCEL NON-BLD 5MMX100MML (ENDOMECHANICALS) IMPLANT
TUBING INSUFFLATOR HEATED (MISCELLANEOUS) ×2 IMPLANT

## 2014-09-27 NOTE — Op Note (Addendum)
Operative Note   09/27/2014 10:06 AM  PRE-OP DIAGNOSIS: ENDOMETRIAL ATYPIA (EIN)   POST-OP DIAGNOSIS: EIN in a polyp with no invasion  SURGEON: Surgeon(s) and Role: * Will Bonnet, MD - Primary * Mellody Drown, MD - Assistant  ANESTHESIA: General ET  PROCEDURE: HYSTERECTOMY TOTAL LAPAROSCOPIC WITH BILATERAL [place] OOPHORECTOMY AND PELVIC SENTINEL LYMPH NODE MAPPING.  ESTIMATED BLOOD LOSS: less than 50 mL  DRAINS: NONE  TOTAL IV FLUIDS: 800 ML  SPECIMENS: UTERUS, BILATERAL TUBES AND OVARIES  COMPLICATIONS: NONE  DISPOSITION: PACU - hemodynamically stable.  CONDITION: stable   INDICATIONS: Atypical endometrial hyperplasia (EIN)  FINDINGS: Normal size uterus with normal appearing tubes and ovaries.  Rest of the abdomen was normal.  SLNs not identified.  Frozen section showed EIN in a polyp without invasion and so no lymph nodes were removed.   PROCEDURE IN DETAIL: After informed consent was obtained, the patient was taken to the operating room where anesthesia was obtained without difficulty. The patient was positioned in the dorsal lithotomy position in [place] and her arms were carefully tucked at her sides and the usual precautions were taken.  She was prepped and draped in normal sterile fashion.  Time-out was performed and a Foley catheter was placed into the bladder and the cervix was infiltrated with 4 ml of methylene blue at 3 an 9 o'clock both superficial and deep injections. A standard VCare uterine manipulator was then placed in the uterus without incident.    An open Hasson technique was used to place an infraumbilical 38-SN baloon trocar under direct visualization. The laparoscope was introduced and CO2 gas was infused for pneumoperitoneum to a pressure of 15 mm Hg.  Right and left lateral 5-mm ports and a 5-12 mm suprapubic port were placed under direct visualization of the laparoscope using an EndoStep technique.  Cytologic washings were obtained.  The  patient was placed in Trendelenburg and the bowel was displaced up into the upper abdomen.  Round ligaments were divided on each side with the EndoShears and the retroperitoneal space was opened bilaterally.  The ureters were identified and preserved.  At this point the retroperitoneal spaces were developed and the lymphatic channels were not able to be mapped. The infundibulopelvic ligaments were skeletonized, sealed and divided with the LigaSure device.  A bladder flap was created and the bladder was dissected down off the lower uterine segment and cervix using endoshears and electrocautery.  The uterine arteries were skeletonized bilaterally, sealed and divided with the LigaSure device.  A colpotomy was performed circumferentially along the V-Care ring with electrocautery and the cervix was incised from the vagina and the specimen was removed through the vagina.  A pneumo balloon was placed in the vagina and the vaginal cuff was then closed in a running continuous fashion using the EndoStitch technique with 0 V-Lock suture with careful attention to include the vaginal cuff angles and the vaginal mucosa within the closure. There was some bleeding near the left uterine artery and this was controlled with the Ligasure.  We also placed 53ml of Surgiceal over the vaginal cuff and hemostasis was noted to be good after the pressure was dropped to 84mm.  All planes of dissection, vascular pedicles and the vaginal cuff were found to be hemostatic.  The suprapubic trocar was removed and the fascia was closed with 0 Vicryl suture using the Endoclose technique. The lateral trocars were removed under visualization.   Before the umbilical trocar was removed the CO2 gas was released.  The  fascia there was closed with 0 Vicryl suture in interrupted technique.  The skin incisions were closed with Indermil glue.  The patient tolerated the procedure well.  Sponge, lap and needle counts were correct x2.  The patient was taken to  recovery room in excellent condition.  Antibiotics: 2 gm Ancef given within one hour of surgery and discontinued within 24 hr.  VTE prophylaxis: was ordered perioperatively.  Mellody Drown, MD   Addendum: As indicated, I was primary surgeon on this procedure.  I was scrubbed for the entirety of this surgery and participated in every aspect of the surgery, with the exception of the injection of the dye in the cervix for lymph node mapping.  I have read and agree with the procedure, as outlined above.  Will Bonnet, MD, [place] 09/30/2014 12:24 PM

## 2014-09-27 NOTE — Anesthesia Preprocedure Evaluation (Signed)
Anesthesia Evaluation  Patient identified by MRN, date of birth, ID band Patient awake    Reviewed: Allergy & Precautions, NPO status   History of Anesthesia Complications Negative for: history of anesthetic complications  Airway Mallampati: II       Dental  (+) Teeth Intact   Pulmonary sleep apnea , former smoker,    Pulmonary exam normal       Cardiovascular negative cardio ROS  Rhythm:Regular Rate:Normal     Neuro/Psych  Headaches, Anxiety    GI/Hepatic Neg liver ROS, GERD-  ,  Endo/Other  negative endocrine ROS  Renal/GU negative Renal ROS  negative genitourinary   Musculoskeletal negative musculoskeletal ROS (+)   Abdominal Normal abdominal exam  (+)   Peds negative pediatric ROS (+)  Hematology negative hematology ROS (+)   Anesthesia Other Findings   Reproductive/Obstetrics negative OB ROS                             Anesthesia Physical Anesthesia Plan  ASA: II  Anesthesia Plan: General   Post-op Pain Management:    Induction: Intravenous  Airway Management Planned: Oral ETT  Additional Equipment:   Intra-op Plan:   Post-operative Plan: Extubation in OR  Informed Consent: I have reviewed the patients History and Physical, chart, labs and discussed the procedure including the risks, benefits and alternatives for the proposed anesthesia with the patient or authorized representative who has indicated his/her understanding and acceptance.     Plan Discussed with: CRNA  Anesthesia Plan Comments:         Anesthesia Quick Evaluation

## 2014-09-27 NOTE — Anesthesia Postprocedure Evaluation (Signed)
  Anesthesia Post-op Note  Patient: Grace Sanchez  Procedure(s) Performed: Procedure(s): HYSTERECTOMY TOTAL LAPAROSCOPIC (N/A) PELVIC LYMPH NODE DISSECTION (N/A) LYMPH NODE BIOPSY (N/A) LAPAROTOMY (N/A)  Anesthesia type:General  Patient location: PACU  Post pain: Pain level controlled  Post assessment: Post-op Vital signs reviewed, Patient's Cardiovascular Status Stable, Respiratory Function Stable, Patent Airway and No signs of Nausea or vomiting  Post vital signs: Reviewed and stable  Last Vitals:  Filed Vitals:   09/27/14 1115  BP:   Pulse: 61  Temp:   Resp: 9    Level of consciousness: awake, alert  and patient cooperative  Complications: No apparent anesthesia complications

## 2014-09-27 NOTE — Transfer of Care (Signed)
Immediate Anesthesia Transfer of Care Note  Patient: Grace Sanchez  Procedure(s) Performed: Procedure(s): HYSTERECTOMY TOTAL LAPAROSCOPIC (N/A) PELVIC LYMPH NODE DISSECTION (N/A) LYMPH NODE BIOPSY (N/A) LAPAROTOMY (N/A)  Patient Location: PACU  Anesthesia Type:General  Level of Consciousness: awake  Airway & Oxygen Therapy: Patient Spontanous Breathing  Post-op Assessment: Report given to RN  Post vital signs: stable  Last Vitals:  Filed Vitals:   09/27/14 1022  BP: 97/61  Pulse: 62  Resp: 12    Complications: No apparent anesthesia complications

## 2014-09-27 NOTE — Anesthesia Procedure Notes (Signed)
Procedure Name: Intubation Date/Time: 09/27/2014 8:16 AM Performed by: Sinda Du Pre-anesthesia Checklist: Patient identified, Patient being monitored, Timeout performed, Emergency Drugs available and Suction available Patient Re-evaluated:Patient Re-evaluated prior to inductionOxygen Delivery Method: Circle system utilized Preoxygenation: Pre-oxygenation with 100% oxygen Intubation Type: IV induction Ventilation: Mask ventilation without difficulty Laryngoscope Size: Mac and 3 Grade View: Grade III Tube type: Oral Tube size: 7.0 mm Number of attempts: 2 Airway Equipment and Method: Stylet Placement Confirmation: ETT inserted through vocal cords under direct vision,  positive ETCO2 and breath sounds checked- equal and bilateral Secured at: 21 cm Tube secured with: Tape Dental Injury: Teeth and Oropharynx as per pre-operative assessment  Comments: Mildly anterior.  Can visualize cords with cricoid pressure

## 2014-09-27 NOTE — Consult Note (Signed)
Patient for TLH/BSO for EIN with sentinel lymph node mapping and biopsy in the event that endometrial cancer is present.There has been no change in the plan in for surgery.  Patient is in agreement with proceeding. Mellody Drown, MD

## 2014-09-28 DIAGNOSIS — C541 Malignant neoplasm of endometrium: Secondary | ICD-10-CM | POA: Diagnosis not present

## 2014-09-28 LAB — CBC
HCT: 39.7 % (ref 35.0–47.0)
Hemoglobin: 13 g/dL (ref 12.0–16.0)
MCH: 29.9 pg (ref 26.0–34.0)
MCHC: 32.7 g/dL (ref 32.0–36.0)
MCV: 91.6 fL (ref 80.0–100.0)
PLATELETS: 246 10*3/uL (ref 150–440)
RBC: 4.33 MIL/uL (ref 3.80–5.20)
RDW: 13.3 % (ref 11.5–14.5)
WBC: 13.7 10*3/uL — ABNORMAL HIGH (ref 3.6–11.0)

## 2014-09-28 LAB — BASIC METABOLIC PANEL
Anion gap: 3 — ABNORMAL LOW (ref 5–15)
BUN: 13 mg/dL (ref 6–20)
CHLORIDE: 108 mmol/L (ref 101–111)
CO2: 30 mmol/L (ref 22–32)
Calcium: 8.7 mg/dL — ABNORMAL LOW (ref 8.9–10.3)
Creatinine, Ser: 0.73 mg/dL (ref 0.44–1.00)
GFR calc Af Amer: >60 mL/min (ref >60)
GFR calc non Af Amer: >60 mL/min (ref >60)
GLUCOSE: 120 mg/dL — AB (ref 65–99)
POTASSIUM: 4 mmol/L (ref 3.5–5.1)
SODIUM: 141 mmol/L (ref 135–145)

## 2014-09-28 NOTE — Progress Notes (Signed)
Patient discharged home. Discharge instructions, prescriptions and follow up appointment given to and reviewed with patient. Patient verbalized understanding. Escorted by auxillary.

## 2014-09-28 NOTE — Discharge Instructions (Signed)

## 2014-09-28 NOTE — Discharge Summary (Signed)
DC Summary Discharge Summary   Patient ID: Grace Sanchez [id number] 54 y.o. [birth year]  Admit date: 09/27/2014  Discharge date: 09/28/2014  Principal Diagnoses:  Endometrial intraepithelial neoplasia  Secondary Diagnoses:  None  Procedures performed during the hospitalization:  1) total laparoscopic hysterectomy 2) bilateral salpingo-oophorectomy 3) sentinel lymph node mapping  HPI: The patient is a 54 year old female who presented with postmenopausal bleeding. She was found to have endometrial intraepithelial neoplasia and was taken to the operating room for the above procedure.  Past Medical History  Diagnosis Date  . Migraine   . Sleep apnea   . GERD (gastroesophageal reflux disease)     Past Surgical History  Procedure Laterality Date  . Cervical polypectomy    . Inguinal hernia repair Left   . Laparoscopic hysterectomy N/A 09/27/2014    Procedure: HYSTERECTOMY TOTAL LAPAROSCOPIC;  Surgeon: Mellody Drown, MD;  Location: [facility] ORS;  Service: Gynecology;  Laterality: N/A;  . Pelvic lymph node dissection N/A 09/27/2014    Procedure: PELVIC LYMPH NODE DISSECTION;  Surgeon: Mellody Drown, MD;  Location: [facility] ORS;  Service: Gynecology;  Laterality: N/A;  . Lymph node biopsy N/A 09/27/2014    Procedure: LYMPH NODE BIOPSY;  Surgeon: Mellody Drown, MD;  Location: [facility] ORS;  Service: Gynecology;  Laterality: N/A;  . Laparotomy N/A 09/27/2014    Procedure: LAPAROTOMY;  Surgeon: Mellody Drown, MD;  Location: [facility] ORS;  Service: Gynecology;  Laterality: N/A;    Allergies  Allergen Reactions  . Codeine Other (See Comments)    Vasovagal response- hypotensive    History  Substance Use Topics  . Smoking status: Former Smoker -- 0.25 packs/day for 4 years    Types: Cigarettes  . Smokeless tobacco: Never Used     Comment: 2 cigarettes a week, during college. Smoke for 4 years during college  . Alcohol Use: 0.6 oz/week    1 Glasses of wine per week     Comment: glass a day     Family History  Problem Relation Age of Onset  . Deep vein thrombosis Mother   . Heart attack Mother   . Diabetes Mother   . Diabetes Brother   . Diabetes Sister   . Diabetes Maternal [facility] Course:  The patient was admitted for the above procedures. She was initially planning on being discharged on day of surgery. However, she had difficulty with pain control. She was observed overnight and had great pain control on oral pain medications. On postoperative day 1 she was ambulating, voiding, tolerating an oral diet, and her pain was well-controlled on oral medications. She was, therefore, acceptable for discharge. Throughout her hospital stay her vital signs were stable and normal. Her postoperative labs were also normal.   Discharge Exam: BP 109/63 mmHg  Pulse 62  Temp(Src) 98.5 F (36.9 C) (Oral)  Resp 18  Ht 5\' 7"  (1.702 m)  Wt 198 lb (89.812 kg)  BMI 31.00 kg/m2  SpO2 100% General  no apparent distress   CV  RRR   Pulmonary  clear to ausculatation bllaterally   Abdomen  Bowel sounds: present  Incisions: clean, dry, intact at all sites  Extremities  no edema, symmetric    Condition at Discharge: Stable  Complications affecting treatment: None  Discharge Medications:    Medication List    STOP taking these medications        acetaminophen 500 MG tablet  Commonly known as:  TYLENOL      TAKE these medications  ibuprofen 600 MG tablet  Commonly known as:  ADVIL,MOTRIN  Take 1 tablet (600 mg total) by mouth every 6 (six) hours as needed for mild pain.     ondansetron 4 MG disintegrating tablet  Commonly known as:  ZOFRAN ODT  Take 1 tablet (4 mg total) by mouth every 8 (eight) hours as needed for nausea or vomiting.     oxyCODONE-acetaminophen 5-325 MG per tablet  Commonly known as:  PERCOCET  Take 2 tablets by mouth every 4 (four) hours as needed for moderate pain or severe pain.         Follow-up arrangements:  In 2 weeks with Dr.  Glennon Mac for incision check  Discharge Disposition: Home  Signed: Will Bonnet, MD, [facility] 09/28/2014 9:52 AM

## 2014-10-02 LAB — SURGICAL PATHOLOGY

## 2014-10-02 LAB — CYTOLOGY - NON PAP

## 2014-10-10 ENCOUNTER — Telehealth: Payer: Self-pay | Admitting: *Deleted

## 2014-10-10 NOTE — Telephone Encounter (Signed)
Spoke with Dr. Fransisca Connors. Dr. Glennon Mac will see patient back post operatively. The patient has a non-invasive cancer, grade 1. Per md, no further gyn oncology appointments are needed at this time. Per Dr. Fransisca Connors, Dr. Glennon Mac can follow the patient every 6 months for the first 3 years and then annually after that.    I called Izora Gala at [facility], Warehouse manager for [employer].  She will communicate this msg to Dr. Glennon Mac.

## 2014-10-24 ENCOUNTER — Other Ambulatory Visit: Payer: Self-pay | Admitting: Obstetrics and Gynecology

## 2014-10-24 ENCOUNTER — Ambulatory Visit
Admission: RE | Admit: 2014-10-24 | Discharge: 2014-10-24 | Disposition: A | Discharge status: Home / Self Care | Payer: Federal, State, Local not specified - PPO | Source: Ambulatory Visit | Attending: Obstetrics and Gynecology | Admitting: Obstetrics and Gynecology

## 2014-10-24 DIAGNOSIS — K5730 Diverticulosis of large intestine without perforation or abscess without bleeding: Secondary | ICD-10-CM | POA: Diagnosis not present

## 2014-10-24 DIAGNOSIS — T814XXA Infection following a procedure, initial encounter: Secondary | ICD-10-CM | POA: Insufficient documentation

## 2014-10-24 DIAGNOSIS — M5136 Other intervertebral disc degeneration, lumbar region: Secondary | ICD-10-CM | POA: Insufficient documentation

## 2014-10-24 DIAGNOSIS — T8140XA Infection following a procedure, unspecified, initial encounter: Secondary | ICD-10-CM

## 2014-10-24 DIAGNOSIS — Z90710 Acquired absence of both cervix and uterus: Secondary | ICD-10-CM | POA: Diagnosis not present

## 2014-10-24 DIAGNOSIS — X58XXXA Exposure to other specified factors, initial encounter: Secondary | ICD-10-CM | POA: Insufficient documentation

## 2014-10-24 HISTORY — DX: Malignant (primary) neoplasm, unspecified: C80.1

## 2014-10-24 MED ORDER — IOHEXOL 300 MG/ML  SOLN
100.0000 mL | Freq: Once | INTRAMUSCULAR | Status: AC | PRN
  Administered 2014-10-24: 100 mL via INTRAVENOUS

## 2015-07-20 DIAGNOSIS — S82432D Displaced oblique fracture of shaft of left fibula, subsequent encounter for closed fracture with routine healing: Secondary | ICD-10-CM | POA: Diagnosis not present

## 2015-07-20 DIAGNOSIS — S82832D Other fracture of upper and lower end of left fibula, subsequent encounter for closed fracture with routine healing: Secondary | ICD-10-CM | POA: Diagnosis not present

## 2015-09-21 DIAGNOSIS — S82832D Other fracture of upper and lower end of left fibula, subsequent encounter for closed fracture with routine healing: Secondary | ICD-10-CM | POA: Diagnosis not present

## 2015-09-21 DIAGNOSIS — S82432D Displaced oblique fracture of shaft of left fibula, subsequent encounter for closed fracture with routine healing: Secondary | ICD-10-CM | POA: Diagnosis not present

## 2015-09-28 ENCOUNTER — Other Ambulatory Visit: Payer: Self-pay | Admitting: Obstetrics and Gynecology

## 2015-09-28 ENCOUNTER — Other Ambulatory Visit: Payer: Self-pay | Admitting: Family Medicine

## 2015-09-28 DIAGNOSIS — Z1231 Encounter for screening mammogram for malignant neoplasm of breast: Secondary | ICD-10-CM

## 2015-10-17 ENCOUNTER — Other Ambulatory Visit: Payer: Self-pay | Admitting: Obstetrics and Gynecology

## 2015-10-17 ENCOUNTER — Ambulatory Visit
Admission: RE | Admit: 2015-10-17 | Discharge: 2015-10-17 | Disposition: A | Discharge status: Home / Self Care | Payer: Federal, State, Local not specified - PPO | Source: Ambulatory Visit | Attending: Obstetrics and Gynecology | Admitting: Obstetrics and Gynecology

## 2015-10-17 DIAGNOSIS — Z1231 Encounter for screening mammogram for malignant neoplasm of breast: Secondary | ICD-10-CM | POA: Insufficient documentation

## 2015-10-18 DIAGNOSIS — C541 Malignant neoplasm of endometrium: Secondary | ICD-10-CM | POA: Diagnosis not present

## 2015-10-18 DIAGNOSIS — Z1389 Encounter for screening for other disorder: Secondary | ICD-10-CM | POA: Diagnosis not present

## 2015-10-18 DIAGNOSIS — Z01419 Encounter for gynecological examination (general) (routine) without abnormal findings: Secondary | ICD-10-CM | POA: Diagnosis not present

## 2015-10-18 DIAGNOSIS — E6609 Other obesity due to excess calories: Secondary | ICD-10-CM | POA: Diagnosis not present

## 2015-10-18 LAB — HM PAP SMEAR: HM PAP: NORMAL

## 2016-02-19 DIAGNOSIS — J3489 Other specified disorders of nose and nasal sinuses: Secondary | ICD-10-CM | POA: Diagnosis not present

## 2016-02-19 DIAGNOSIS — K080 Exfoliation of teeth due to systemic causes: Secondary | ICD-10-CM | POA: Diagnosis not present

## 2016-02-19 DIAGNOSIS — Z1281 Encounter for screening for malignant neoplasm of oral cavity: Secondary | ICD-10-CM | POA: Diagnosis not present

## 2016-02-19 DIAGNOSIS — M279 Disease of jaws, unspecified: Secondary | ICD-10-CM | POA: Diagnosis not present

## 2016-02-19 DIAGNOSIS — K148 Other diseases of tongue: Secondary | ICD-10-CM | POA: Diagnosis not present

## 2016-02-19 DIAGNOSIS — J342 Deviated nasal septum: Secondary | ICD-10-CM | POA: Diagnosis not present

## 2016-02-19 DIAGNOSIS — K05321 Chronic periodontitis, generalized, slight: Secondary | ICD-10-CM | POA: Diagnosis not present

## 2016-02-19 DIAGNOSIS — K08409 Partial loss of teeth, unspecified cause, unspecified class: Secondary | ICD-10-CM | POA: Diagnosis not present

## 2016-02-22 DIAGNOSIS — K080 Exfoliation of teeth due to systemic causes: Secondary | ICD-10-CM | POA: Diagnosis not present

## 2016-03-03 DIAGNOSIS — K080 Exfoliation of teeth due to systemic causes: Secondary | ICD-10-CM | POA: Diagnosis not present

## 2016-03-12 DIAGNOSIS — K080 Exfoliation of teeth due to systemic causes: Secondary | ICD-10-CM | POA: Diagnosis not present

## 2016-03-13 ENCOUNTER — Ambulatory Visit (INDEPENDENT_AMBULATORY_CARE_PROVIDER_SITE_OTHER): Payer: Federal, State, Local not specified - PPO | Admitting: Family Medicine

## 2016-03-13 VITALS — BP 122/82 | HR 72 | Temp 98.2°F | Ht 67.0 in | Wt 201.0 lb

## 2016-03-13 DIAGNOSIS — N342 Other urethritis: Secondary | ICD-10-CM

## 2016-03-13 DIAGNOSIS — H1033 Unspecified acute conjunctivitis, bilateral: Secondary | ICD-10-CM

## 2016-03-13 MED ORDER — NITROFURANTOIN MONOHYD MACRO 100 MG PO CAPS: 100.0000 mg | ORAL_CAPSULE | Freq: Two times a day (BID) | ORAL | 0 refills | Status: DC

## 2016-03-13 MED ORDER — SULFACETAMIDE SODIUM 10 % OP SOLN: 1.0000 [drp] | OPHTHALMIC | 0 refills | Status: DC

## 2016-03-13 MED ORDER — TOBRAMYCIN 0.3 % OP SOLN: 1.0000 [drp] | OPHTHALMIC | 0 refills | Status: DC

## 2016-03-13 MED ORDER — SULFAMETHOXAZOLE-TRIMETHOPRIM 800-160 MG PO TABS: 1.0000 | ORAL_TABLET | Freq: Two times a day (BID) | ORAL | 0 refills | Status: DC

## 2016-03-13 MED ORDER — FLUCONAZOLE 150 MG PO TABS: 150.0000 mg | ORAL_TABLET | Freq: Once | ORAL | 0 refills | Status: AC

## 2016-03-13 NOTE — Progress Notes (Signed)
Name: Grace Sanchez   MRN: [id number]    DOB: Mar 02, 1961   Date:03/13/2016       Progress Note  Subjective  Chief Complaint  Chief Complaint  Patient presents with  . Urinary Tract Infection    Pt stated having burning the end of the urination for about 6 weeks.  . Eye Pain    Lt eye is tender    Urinary Tract Infection   This is a new problem. The current episode started in the past 7 days. The problem occurs intermittently. The problem has been waxing and waning. The quality of the pain is described as burning. The pain is at a severity of 2/10. The pain is mild. There has been no fever. Associated symptoms include frequency. Pertinent negatives include no chills, discharge, flank pain, hematuria, hesitancy, nausea, possible pregnancy, sweats, urgency or vomiting. Associated symptoms comments: dysuria. She has tried nothing for the symptoms. The treatment provided mild relief. There is no history of catheterization, kidney stones, recurrent UTIs, a single kidney, urinary stasis or a urological procedure.  Eye Pain   The right eye is affected. This is a new problem. The current episode started today. The problem occurs daily. There was no injury mechanism. The pain is mild. Pertinent negatives include no blurred vision, eye discharge, double vision, eye redness, fever, foreign body sensation, itching, nausea, photophobia, recent URI, tingling or vomiting. She has tried nothing for the symptoms. The treatment provided mild relief.    No problem-specific Assessment & Plan notes found for this encounter.   Past Medical History:  Diagnosis Date  . Cancer ([place])    endometrial ca newly dx 6.2016  . GERD (gastroesophageal reflux disease)   . Migraine   . Sleep apnea     Past Surgical History:  Procedure Laterality Date  . CERVICAL POLYPECTOMY    . INGUINAL HERNIA REPAIR Left   . LAPAROSCOPIC HYSTERECTOMY N/A 09/27/2014   Procedure: HYSTERECTOMY TOTAL LAPAROSCOPIC;  Surgeon: Mellody Drown, MD;  Location: [facility];  Service: Gynecology;  Laterality: N/A;  . LAPAROTOMY N/A 09/27/2014   Procedure: LAPAROTOMY;  Surgeon: Mellody Drown, MD;  Location: [facility];  Service: Gynecology;  Laterality: N/A;  . LYMPH NODE BIOPSY N/A 09/27/2014   Procedure: LYMPH NODE BIOPSY;  Surgeon: Mellody Drown, MD;  Location: [facility];  Service: Gynecology;  Laterality: N/A;  . PELVIC LYMPH NODE DISSECTION N/A 09/27/2014   Procedure: PELVIC LYMPH NODE DISSECTION;  Surgeon: Mellody Drown, MD;  Location: [facility];  Service: Gynecology;  Laterality: N/A;    Family History  Problem Relation Age of Onset  . Deep vein thrombosis Mother   . Heart attack Mother   . Diabetes Mother   . Diabetes Brother   . Diabetes Sister   . Diabetes Maternal Aunt     Social History   Social History  . Marital status: Married    Spouse name: N/A  . Number of children: N/A  . Years of education: N/A   Occupational History  . Not on file.   Social History Main Topics  . Smoking status: Former Smoker    Packs/day: 0.25    Years: 4.00    Types: Cigarettes  . Smokeless tobacco: Never Used     Comment: 2 cigarettes a week, during college. Smoke for 4 years during college  . Alcohol use 0.6 oz/week    1 Glasses of wine per week     Comment: glass a day  . Drug use: No  . Sexual  activity: Not on file   Other Topics Concern  . Not on file   Social History Narrative  . No narrative on file    Allergies  Allergen Reactions  . Codeine Other (See Comments)    Vasovagal response- hypotensive     Review of Systems  Constitutional: Negative for chills, fever, malaise/fatigue and weight loss.  HENT: Negative for ear discharge, ear pain and sore throat.   Eyes: Positive for pain. Negative for blurred vision, double vision, photophobia, discharge, redness and itching.  Respiratory: Negative for cough, sputum production, shortness of breath and wheezing.   Cardiovascular: Negative for chest pain,  palpitations and leg swelling.  Gastrointestinal: Negative for abdominal pain, blood in stool, constipation, diarrhea, heartburn, melena, nausea and vomiting.  Genitourinary: Positive for frequency. Negative for dysuria, flank pain, hematuria, hesitancy and urgency.  Musculoskeletal: Negative for back pain, joint pain, myalgias and neck pain.  Skin: Negative for rash.  Neurological: Negative for dizziness, tingling, sensory change, focal weakness and headaches.  Endo/Heme/Allergies: Negative for environmental allergies and polydipsia. Does not bruise/bleed easily.  Psychiatric/Behavioral: Negative for depression and suicidal ideas. The patient is not nervous/anxious and does not have insomnia.      Objective  Vitals:   03/13/16 1123  BP: 122/82  Pulse: 72  Temp: 98.2 F (36.8 C)  SpO2: 99%  Weight: 201 lb (91.2 kg)  Height: 5\' 7"  (1.702 m)    Physical Exam  Constitutional: She is well-developed, well-nourished, and in no distress. No distress.  HENT:  Head: Normocephalic and atraumatic.  Right Ear: External ear normal.  Left Ear: External ear normal.  Nose: Nose normal.  Mouth/Throat: Oropharynx is clear and moist.  Eyes: Conjunctivae and EOM are normal. Pupils are equal, round, and reactive to light. Right eye exhibits no discharge. Left eye exhibits no discharge.  Neck: Normal range of motion. Neck supple. No JVD present. No thyromegaly present.  Cardiovascular: Normal rate, regular rhythm, normal heart sounds and intact distal pulses.  Exam reveals no gallop and no friction rub.   No murmur heard. Pulmonary/Chest: Effort normal and breath sounds normal. She has no wheezes. She has no rales.  Abdominal: Soft. Bowel sounds are normal. She exhibits no mass. There is no tenderness. There is no guarding.  Musculoskeletal: Normal range of motion. She exhibits no edema.  Lymphadenopathy:    She has no cervical adenopathy.  Neurological: She is alert.  Skin: Skin is warm and dry.  She is not diaphoretic.  Psychiatric: Mood and affect normal.  Nursing note and vitals reviewed.     Assessment & Plan  Problem List Items Addressed This Visit    None    Visit Diagnoses    Urethritis    -  Primary   Relevant Medications   sulfamethoxazole-trimethoprim (BACTRIM DS,SEPTRA DS) 800-160 MG tablet   fluconazole (DIFLUCAN) 150 MG tablet   Acute conjunctivitis of both eyes, unspecified acute conjunctivitis type       Relevant Medications   sulfacetamide (BLEPH-10) 10 % ophthalmic solution        Dr. Christapher [facility]  03/13/16

## 2016-03-13 NOTE — Addendum Note (Signed)
Addended by: Elta Guadeloupe on: 03/13/2016 05:34 PM   Modules accepted: Orders

## 2016-05-21 DIAGNOSIS — H5203 Hypermetropia, bilateral: Secondary | ICD-10-CM | POA: Diagnosis not present

## 2016-06-04 DIAGNOSIS — H0011 Chalazion right upper eyelid: Secondary | ICD-10-CM | POA: Diagnosis not present

## 2016-06-12 DIAGNOSIS — H0011 Chalazion right upper eyelid: Secondary | ICD-10-CM | POA: Diagnosis not present

## 2016-07-10 ENCOUNTER — Encounter: Payer: Self-pay | Admitting: Obstetrics and Gynecology

## 2016-07-11 ENCOUNTER — Encounter: Payer: Self-pay | Admitting: Obstetrics and Gynecology

## 2016-07-11 ENCOUNTER — Ambulatory Visit (INDEPENDENT_AMBULATORY_CARE_PROVIDER_SITE_OTHER): Payer: Federal, State, Local not specified - PPO | Admitting: Obstetrics and Gynecology

## 2016-07-11 DIAGNOSIS — R399 Unspecified symptoms and signs involving the genitourinary system: Secondary | ICD-10-CM | POA: Insufficient documentation

## 2016-07-11 DIAGNOSIS — C541 Malignant neoplasm of endometrium: Secondary | ICD-10-CM | POA: Diagnosis not present

## 2016-07-11 NOTE — Progress Notes (Signed)
Obstetrics & Gynecology Office Visit   Chief Complaint  Patient presents with  . f/u endometrial cancer    History of Present Illness: 56 y.o. G6P2002 female who presents for routine endometrial cancer surviallance. She was diagnosed 1.5 years ago. She was last seen 9 months ago. Today she denies headaches, cough, weight changes, bloating, constipation, vaginal bleeding.  Endometrial Cancer history:  diagnosed with Endometrial Cancer/Polyp and is status post TLH/BSO on 11/2014. She is sexually active and reports no problems with intercourse..    Review of Systems: Review of Systems  Constitutional: Negative.   HENT: Negative.   Eyes: Negative.   Respiratory: Negative.   Cardiovascular: Negative.   Gastrointestinal: Negative.   Genitourinary: Negative.   Musculoskeletal: Negative.   Skin: Negative.   Neurological: Negative.   Psychiatric/Behavioral: Negative.     Past Medical History:  Diagnosis Date  . Cancer ([place])    endometrial ca newly dx 6.2016  . Endometrial cancer ([place])   . GERD (gastroesophageal reflux disease)   . Migraine   . Sleep apnea     Past Surgical History:  Procedure Laterality Date  . CERVICAL POLYPECTOMY    . INGUINAL HERNIA REPAIR Left   . LAPAROSCOPIC HYSTERECTOMY N/A 09/27/2014   Procedure: HYSTERECTOMY TOTAL LAPAROSCOPIC;  Surgeon: Prentice Docker, MD;  Location: [facility];  Service: Gynecology;  Laterality: N/A;  . LAPAROTOMY N/A 09/27/2014   Procedure: LAPAROTOMY;  Surgeon: Mellody Drown, MD;  Location: [facility];  Service: Gynecology;  Laterality: N/A;  . LYMPH NODE BIOPSY N/A 09/27/2014   Procedure: LYMPH NODE BIOPSY;  Surgeon: Mellody Drown, MD;  Location: [facility];  Service: Gynecology;  Laterality: N/A;  . MYOMECTOMY    . PELVIC LYMPH NODE DISSECTION N/A 09/27/2014   Procedure: PELVIC LYMPH NODE DISSECTION;  Surgeon: Mellody Drown, MD;  Location: [facility];  Service: Gynecology;  Laterality: N/A;    Gynecologic History: No LMP  recorded. Patient has had a hysterectomy.  Obstetric History: [id number]  Family History  Problem Relation Age of Onset  . Deep vein thrombosis Mother   . Heart attack Mother   . Diabetes Mother   . Diabetes Brother   . Diabetes Sister   . Diabetes Maternal Aunt   . Colon cancer Father     Social History   Social History  . Marital status: Married    Spouse name: N/A  . Number of children: N/A  . Years of education: N/A   Occupational History  . Not on file.   Social History Main Topics  . Smoking status: Never Smoker  . Smokeless tobacco: Never Used     Comment: 2 cigarettes a week, during college. Smoke for 4 years during college  . Alcohol use 0.6 oz/week    1 Glasses of wine per week     Comment: glass a day  . Drug use: No  . Sexual activity: Not on file   Other Topics Concern  . Not on file   Social History Narrative  . No narrative on file    Allergies  Allergen Reactions  . Codeine Other (See Comments)    Vasovagal response- hypotensive  . Sulfa Antibiotics     Medications:   Medication Sig Dispense Refill  . tobramycin (TOBREX) 0.3 % ophthalmic solution Place 1 drop into both eyes every 4 (four) hours. 5 mL 0   Physical Exam BP 122/80   Pulse 75   Ht 5\' 7"  (1.702 m)   Wt 203 lb (92.1 kg)  BMI 31.79 kg/m   No LMP recorded. Patient has had a hysterectomy.  Physical Exam  Constitutional: She appears well-developed and well-nourished. No distress.  Genitourinary: Pelvic exam was performed with patient supine. There is no rash, tenderness or lesion on the right labia. There is no rash, tenderness or lesion on the left labia. Vagina exhibits no lesion. No erythema or bleeding in the vagina.  Genitourinary Comments: Cervix: Surgically absent Vaginal cuff intact without lesions No pelvic mass noted on bimanual exam. No tenderness  HENT:  Head: Normocephalic and atraumatic.  Eyes: EOM are normal. No scleral icterus.  Neck: Normal range of motion.  Neck supple. No thyromegaly present.  Cardiovascular: Normal rate and regular rhythm.  Exam reveals no gallop and no friction rub.   No murmur heard. Pulmonary/Chest: Effort normal and breath sounds normal. No respiratory distress. She has no wheezes. She has no rales.  Abdominal: Soft. Bowel sounds are normal. She exhibits no distension and no mass. There is no tenderness. There is no guarding.  Lymphadenopathy:    She has no cervical adenopathy.       Right: No inguinal adenopathy present.       Left: No inguinal adenopathy present.  Skin: Skin is warm and dry. No rash noted. No erythema.  Psychiatric: She has a normal mood and affect. Her behavior is normal. Judgment normal.   Female chaperone present for pelvic and breast  portions of the physical exam  Assessment: 56 y.o. Y4M2500 here for surveillance of endometrial cancer, no evidence of cancer today.  Plan: Follow up in six months. Will continue surveillance at 6 month intervals until 2 years since surgery, then yearly.   15 minutes spent in face to face discussion with > 50% spent in counseling and management of her endometrial cancer.   Prentice Docker, MD 07/11/2016 8:36 AM

## 2016-07-14 DIAGNOSIS — K080 Exfoliation of teeth due to systemic causes: Secondary | ICD-10-CM | POA: Diagnosis not present

## 2016-08-05 DIAGNOSIS — Z86018 Personal history of other benign neoplasm: Secondary | ICD-10-CM | POA: Diagnosis not present

## 2016-08-05 DIAGNOSIS — Z872 Personal history of diseases of the skin and subcutaneous tissue: Secondary | ICD-10-CM | POA: Diagnosis not present

## 2016-08-05 DIAGNOSIS — L570 Actinic keratosis: Secondary | ICD-10-CM | POA: Diagnosis not present

## 2016-11-14 DIAGNOSIS — H0011 Chalazion right upper eyelid: Secondary | ICD-10-CM | POA: Diagnosis not present

## 2016-11-26 DIAGNOSIS — L718 Other rosacea: Secondary | ICD-10-CM | POA: Diagnosis not present

## 2016-12-17 DIAGNOSIS — L718 Other rosacea: Secondary | ICD-10-CM | POA: Diagnosis not present

## 2017-01-12 ENCOUNTER — Encounter: Payer: Self-pay | Admitting: Obstetrics and Gynecology

## 2017-01-12 ENCOUNTER — Ambulatory Visit (INDEPENDENT_AMBULATORY_CARE_PROVIDER_SITE_OTHER): Payer: Federal, State, Local not specified - PPO | Admitting: Obstetrics and Gynecology

## 2017-01-12 VITALS — BP 118/74 | Ht 66.0 in | Wt 174.0 lb

## 2017-01-12 DIAGNOSIS — Z01419 Encounter for gynecological examination (general) (routine) without abnormal findings: Secondary | ICD-10-CM | POA: Diagnosis not present

## 2017-01-12 DIAGNOSIS — Z131 Encounter for screening for diabetes mellitus: Secondary | ICD-10-CM

## 2017-01-12 DIAGNOSIS — Z1331 Encounter for screening for depression: Secondary | ICD-10-CM | POA: Diagnosis not present

## 2017-01-12 DIAGNOSIS — Z1231 Encounter for screening mammogram for malignant neoplasm of breast: Secondary | ICD-10-CM

## 2017-01-12 DIAGNOSIS — Z90710 Acquired absence of both cervix and uterus: Secondary | ICD-10-CM | POA: Diagnosis not present

## 2017-01-12 DIAGNOSIS — Z0000 Encounter for general adult medical examination without abnormal findings: Secondary | ICD-10-CM | POA: Diagnosis not present

## 2017-01-12 DIAGNOSIS — C541 Malignant neoplasm of endometrium: Secondary | ICD-10-CM | POA: Diagnosis not present

## 2017-01-12 DIAGNOSIS — Z1329 Encounter for screening for other suspected endocrine disorder: Secondary | ICD-10-CM | POA: Diagnosis not present

## 2017-01-12 DIAGNOSIS — Z13220 Encounter for screening for lipoid disorders: Secondary | ICD-10-CM

## 2017-01-12 DIAGNOSIS — Z1339 Encounter for screening examination for other mental health and behavioral disorders: Secondary | ICD-10-CM | POA: Diagnosis not present

## 2017-01-12 DIAGNOSIS — Z1239 Encounter for other screening for malignant neoplasm of breast: Secondary | ICD-10-CM

## 2017-01-12 NOTE — Progress Notes (Signed)
Routine Annual Gynecology Examination   PCP: Juline Patch, MD  Chief Complaint  Patient presents with  . Annual Exam  Surveillance for endometrial cancer  History of Present Illness: Patient is a 56 y.o. [id number] presents for annual exam. The patient has no complaints today.   Menopausal bleeding: denies  Menopausal symptoms: denies  Breast symptoms: denies  Last pap smear: 1 years ago.  Result Normal  Last mammogram: 1 years ago.  Result Normal   For endometrial cancer, she was diagnosed 2 years ago. She was last seen about 6 months ago.  She denies headaches, cough, weight changes (she has been intentionally losing weight), bloating, constipation, vaginal bleeding.    She has never had a colonoscopy.   Past Medical History:  Diagnosis Date  . Cancer ([place])    endometrial ca newly dx 6.2016  . Endometrial cancer ([place])   . GERD (gastroesophageal reflux disease)   . Migraine   . Sleep apnea     Past Surgical History:  Procedure Laterality Date  . CERVICAL POLYPECTOMY    . INGUINAL HERNIA REPAIR Left   . LAPAROSCOPIC HYSTERECTOMY N/A 09/27/2014   Procedure: HYSTERECTOMY TOTAL LAPAROSCOPIC;  Surgeon: Prentice Docker, MD;  Location: [facility];  Service: Gynecology;  Laterality: N/A;  . LAPAROTOMY N/A 09/27/2014   Procedure: LAPAROTOMY;  Surgeon: Mellody Drown, MD;  Location: [facility];  Service: Gynecology;  Laterality: N/A;  . LYMPH NODE BIOPSY N/A 09/27/2014   Procedure: LYMPH NODE BIOPSY;  Surgeon: Mellody Drown, MD;  Location: [facility];  Service: Gynecology;  Laterality: N/A;  . MYOMECTOMY    . PELVIC LYMPH NODE DISSECTION N/A 09/27/2014   Procedure: PELVIC LYMPH NODE DISSECTION;  Surgeon: Mellody Drown, MD;  Location: [facility];  Service: Gynecology;  Laterality: N/A;    Prior to Admission medications   Medication Sig Start Date End Date Taking? Authorizing Provider  tobramycin (TOBREX) 0.3 % ophthalmic solution Place 1 drop into both eyes every 4 (four)  hours. Patient not taking: Reported on 01/12/2017 03/13/16   Juline Patch, MD    Allergies  Allergen Reactions  . Codeine Other (See Comments)    Vasovagal response- hypotensive  . Sulfa Antibiotics    Obstetric History: [id number]  Social History   Social History  . Marital status: Married    Spouse name: N/A  . Number of children: N/A  . Years of education: N/A   Occupational History  . Not on file.   Social History Main Topics  . Smoking status: Never Smoker  . Smokeless tobacco: Never Used     Comment: 2 cigarettes a week, during college. Smoke for 4 years during college  . Alcohol use 0.6 oz/week    1 Glasses of wine per week     Comment: glass a day  . Drug use: No  . Sexual activity: Not on file   Other Topics Concern  . Not on file   Social History Narrative  . No narrative on file    Family History  Problem Relation Age of Onset  . Deep vein thrombosis Mother   . Heart attack Mother   . Diabetes Mother   . Diabetes Brother   . Diabetes Sister   . Diabetes Maternal Aunt   . Colon cancer Father     Review of Systems  Constitutional: Negative.   HENT: Negative.   Eyes: Negative.   Respiratory: Negative.   Cardiovascular: Negative.   Gastrointestinal: Negative.   Genitourinary: Negative.   Musculoskeletal:  Negative.   Skin: Negative.   Neurological: Negative.   Psychiatric/Behavioral: Negative.      Physical Exam Vitals: BP 118/74   Ht 5\' 6"  (1.676 m)   Wt 174 lb (78.9 kg)   BMI 28.08 kg/m   Physical Exam  Constitutional: She is oriented to person, place, and time. She appears well-developed and well-nourished. No distress.  Genitourinary: Vagina normal. Pelvic exam was performed with patient supine. There is no rash, tenderness or lesion on the right labia. There is no rash, tenderness or lesion on the left labia. Vagina exhibits no lesion. No tenderness or bleeding in the vagina. No signs of injury around the vagina. No vaginal discharge  found. Right adnexum does not display mass, does not display tenderness and does not display fullness. Left adnexum does not display mass, does not display tenderness and does not display fullness.  Genitourinary Comments: Uterus and cervix surgically absent. No pelvic fullness or tenderness. No obvious lesions to suggest cancer recurrence  HENT:  Head: Normocephalic and atraumatic.  Eyes: EOM are normal. No scleral icterus.  Neck: Normal range of motion. Neck supple. No thyromegaly present.  Cardiovascular: Normal rate and regular rhythm.  Exam reveals no gallop and no friction rub.   No murmur heard. Pulmonary/Chest: Effort normal and breath sounds normal. No respiratory distress. She has no wheezes. She has no rales. Right breast exhibits no inverted nipple, no mass, no nipple discharge, no skin change and no tenderness. Left breast exhibits no inverted nipple, no mass, no nipple discharge, no skin change and no tenderness.  Abdominal: Soft. Bowel sounds are normal. She exhibits no distension and no mass. There is no tenderness. There is no rebound and no guarding.  Musculoskeletal: Normal range of motion. She exhibits no edema.  Lymphadenopathy:    She has no cervical adenopathy.  Neurological: She is alert and oriented to person, place, and time. No cranial nerve deficit.  Skin: Skin is warm and dry. No erythema.  Psychiatric: She has a normal mood and affect. Her behavior is normal. Judgment normal.    Female chaperone present for pelvic and breast  portions of the physical exam  Results: AUDIT Questionnaire (screen for alcoholism): 3 PHQ-9: 0   Assessment and Plan:  56 y.o. G58P2002 female here for routine annual gynecologic examination  Plan: Problem List Items Addressed This Visit    Status post hysterectomy   Endometrial cancer ([place])    Other Visit Diagnoses    Women's annual routine gynecological examination    -  Primary   Relevant Orders   MM DIGITAL SCREENING BILATERAL    Hgb A1c w/o eAG   TSH + free T4   VITAMIN D 25 Hydroxy (Vit-D Deficiency, Fractures)   Lipid Panel With LDL/HDL Ratio   Comprehensive metabolic panel   CBC   Screening for depression       Screening for alcohol problem       Screening for diabetes mellitus       Relevant Orders   Hgb A1c w/o eAG   Screening for thyroid disorder       Relevant Orders   TSH + free T4   Screening cholesterol level       Relevant Orders   Lipid Panel With LDL/HDL Ratio   Laboratory examination ordered as part of a routine general medical examination       Relevant Orders   VITAMIN D 25 Hydroxy (Vit-D Deficiency, Fractures)   Comprehensive metabolic panel   CBC  Screening for breast cancer       Relevant Orders   MM DIGITAL SCREENING BILATERAL      Screening: -- Blood pressure screen normal -- Colonoscopy - due. Patient has put this off. Discussed importance (her father had colon cancer). She voiced understanding and will discuss with her new PCP. -- Mammogram - due. Patient to call [facility] to arrange. She understands that it is her responsibility to arrange this. -- Weight screening: overweight: continue to monitor -- Depression screening negative (PHQ-9) -- Nutrition: normal -- cholesterol screening: will obtain -- osteoporosis screening: not due -- tobacco screening: not using -- alcohol screening: AUDIT questionnaire indicates low-risk usage. -- family history of breast cancer screening: done. not at high risk. -- no evidence of domestic violence or intimate partner violence. -- STD screening: gonorrhea/chlamydia NAAT not collected per patient request. -- pap smear not collected per ASCCP guidelines -- flu vaccine declines -- HPV vaccination series: not eligilbe   Prentice Docker, MD 01/12/2017 9:32 AM

## 2017-01-13 LAB — COMPREHENSIVE METABOLIC PANEL
ALK PHOS: 53 IU/L (ref 39–117)
ALT: 11 IU/L (ref 0–32)
AST: 15 IU/L (ref 0–40)
Albumin/Globulin Ratio: 1.8 (ref 1.2–2.2)
Albumin: 4.3 g/dL (ref 3.5–5.5)
BUN/Creatinine Ratio: 23 (ref 9–23)
BUN: 18 mg/dL (ref 6–24)
Bilirubin Total: 0.6 mg/dL (ref 0.0–1.2)
CO2: 24 mmol/L (ref 20–29)
Calcium: 9.9 mg/dL (ref 8.7–10.2)
Chloride: 100 mmol/L (ref 96–106)
Creatinine, Ser: 0.77 mg/dL (ref 0.57–1.00)
GFR calc Af Amer: 100 mL/min/{1.73_m2} (ref >59)
GFR calc non Af Amer: 87 mL/min/{1.73_m2} (ref >59)
GLOBULIN, TOTAL: 2.4 g/dL (ref 1.5–4.5)
GLUCOSE: 82 mg/dL (ref 65–99)
Potassium: 4.3 mmol/L (ref 3.5–5.2)
SODIUM: 141 mmol/L (ref 134–144)
Total Protein: 6.7 g/dL (ref 6.0–8.5)

## 2017-01-13 LAB — CBC
Hematocrit: 44.5 % (ref 34.0–46.6)
Hemoglobin: 15.2 g/dL (ref 11.1–15.9)
MCH: 30 pg (ref 26.6–33.0)
MCHC: 34.2 g/dL (ref 31.5–35.7)
MCV: 88 fL (ref 79–97)
PLATELETS: 298 10*3/uL (ref 150–379)
RBC: 5.06 x10E6/uL (ref 3.77–5.28)
RDW: 13.9 % (ref 12.3–15.4)
WBC: 7.5 10*3/uL (ref 3.4–10.8)

## 2017-01-13 LAB — LIPID PANEL WITH LDL/HDL RATIO
Cholesterol, Total: 176 mg/dL (ref 100–199)
HDL: 58 mg/dL (ref >39)
LDL CALC: 102 mg/dL — AB (ref 0–99)
LDL/HDL RATIO: 1.8 ratio (ref 0.0–3.2)
Triglycerides: 78 mg/dL (ref 0–149)
VLDL CHOLESTEROL CAL: 16 mg/dL (ref 5–40)

## 2017-01-13 LAB — TSH+FREE T4
Free T4: 1.46 ng/dL (ref 0.82–1.77)
TSH: 1.83 u[IU]/mL (ref 0.450–4.500)

## 2017-01-13 LAB — HGB A1C W/O EAG: HEMOGLOBIN A1C: 5.3 % (ref 4.8–5.6)

## 2017-01-13 LAB — VITAMIN D 25 HYDROXY (VIT D DEFICIENCY, FRACTURES): Vit D, 25-Hydroxy: 26.9 ng/mL — ABNORMAL LOW (ref 30.0–100.0)

## 2017-06-05 ENCOUNTER — Encounter: Payer: Self-pay | Admitting: Family Medicine

## 2017-06-05 ENCOUNTER — Ambulatory Visit (INDEPENDENT_AMBULATORY_CARE_PROVIDER_SITE_OTHER): Payer: Federal, State, Local not specified - PPO | Admitting: Family Medicine

## 2017-06-05 VITALS — BP 120/70 | HR 60 | Ht 66.0 in | Wt 160.0 lb

## 2017-06-05 DIAGNOSIS — M67912 Unspecified disorder of synovium and tendon, left shoulder: Secondary | ICD-10-CM | POA: Diagnosis not present

## 2017-06-05 DIAGNOSIS — R1032 Left lower quadrant pain: Secondary | ICD-10-CM

## 2017-06-05 DIAGNOSIS — R1030 Lower abdominal pain, unspecified: Secondary | ICD-10-CM | POA: Diagnosis not present

## 2017-06-05 DIAGNOSIS — Z1211 Encounter for screening for malignant neoplasm of colon: Secondary | ICD-10-CM

## 2017-06-05 NOTE — Progress Notes (Signed)
Name: Grace Sanchez   MRN: [id number]    DOB: [birth year]   Date:06/05/2017       Progress Note  Subjective  Chief Complaint  Chief Complaint  Patient presents with  . Shoulder Pain    x 1 year- hurting in L) shoulder, heard a "click or a pop" and is getting worse with ROM- reaching behind or putting a coat on  . hernia repair    had a hernia repair in 2006 on L) side- has started having a "tingling sensation" in September in the same area as hernia repair was    Shoulder Pain   The pain is present in the left shoulder. This is a new problem. The current episode started more than 1 year ago (12-18 mos). There has been no history of extremity trauma (movement in bed/snap or pop). The problem occurs daily (reaching backwrds). The problem has been waxing and waning. The quality of the pain is described as aching. The pain is at a severity of 1/10 (8/10 with movement). The pain is severe. Associated symptoms include an inability to bear weight and a limited range of motion. Pertinent negatives include no fever, itching, joint locking, joint swelling, numbness, stiffness or tingling. The symptoms are aggravated by activity (reaching backwards). She has tried nothing for the symptoms. hernia repair with mesh  Groin Pain  The patient's pertinent negatives include no genital itching, genital lesions, genital odor, genital rash, missed menses, pelvic pain, vaginal bleeding or vaginal discharge. This is a recurrent problem. The current episode started more than 1 month ago (since surgery). The problem occurs daily. The problem has been gradually worsening. The pain is mild. The problem affects the left side. Pertinent negatives include no abdominal pain, anorexia, back pain, chills, constipation, diarrhea, discolored urine, dysuria, fever, flank pain, frequency, headaches, hematuria, joint pain, joint swelling, nausea, painful intercourse, rash, sore throat, urgency or vomiting. The symptoms are aggravated by  activity. She has tried nothing for the symptoms. (Hernia repair with mesh)    No problem-specific Assessment & Plan notes found for this encounter.   Past Medical History:  Diagnosis Date  . Cancer ([place])    endometrial ca newly dx 6.2016  . Endometrial cancer ([place])   . GERD (gastroesophageal reflux disease)   . Migraine   . Sleep apnea     Past Surgical History:  Procedure Laterality Date  . CERVICAL POLYPECTOMY    . INGUINAL HERNIA REPAIR Left   . LAPAROSCOPIC HYSTERECTOMY N/A 09/27/2014   Procedure: HYSTERECTOMY TOTAL LAPAROSCOPIC;  Surgeon: Prentice Docker, MD;  Location: [facility];  Service: Gynecology;  Laterality: N/A;  . LAPAROTOMY N/A 09/27/2014   Procedure: LAPAROTOMY;  Surgeon: Mellody Drown, MD;  Location: [facility];  Service: Gynecology;  Laterality: N/A;  . LYMPH NODE BIOPSY N/A 09/27/2014   Procedure: LYMPH NODE BIOPSY;  Surgeon: Mellody Drown, MD;  Location: [facility];  Service: Gynecology;  Laterality: N/A;  . MYOMECTOMY    . PELVIC LYMPH NODE DISSECTION N/A 09/27/2014   Procedure: PELVIC LYMPH NODE DISSECTION;  Surgeon: Mellody Drown, MD;  Location: [facility];  Service: Gynecology;  Laterality: N/A;    Family History  Problem Relation Age of Onset  . Deep vein thrombosis Mother   . Heart attack Mother   . Diabetes Mother   . Diabetes Brother   . Diabetes Sister   . Diabetes Maternal Aunt   . Colon cancer Father     Social History   Socioeconomic History  . Marital status: Married  Spouse name: Not on file  . Number of children: Not on file  . Years of education: Not on file  . Highest education level: Not on file  Social Needs  . Financial resource strain: Not on file  . Food insecurity - worry: Not on file  . Food insecurity - inability: Not on file  . Transportation needs - medical: Not on file  . Transportation needs - non-medical: Not on file  Occupational History  . Not on file  Tobacco Use  . Smoking status: Never Smoker  . Smokeless  tobacco: Never Used  . Tobacco comment: 2 cigarettes a week, during college. Smoke for 4 years during college  Substance and Sexual Activity  . Alcohol use: Yes    Alcohol/week: 0.6 oz    Types: 1 Glasses of wine per week    Comment: glass a day  . Drug use: No  . Sexual activity: Not on file  Other Topics Concern  . Not on file  Social History Narrative  . Not on file    Allergies  Allergen Reactions  . Codeine Other (See Comments)    Vasovagal response- hypotensive  . Sulfa Antibiotics     Outpatient Medications Prior to Visit  Medication Sig Dispense Refill  . tobramycin (TOBREX) 0.3 % ophthalmic solution Place 1 drop into both eyes every 4 (four) hours. (Patient not taking: Reported on 01/12/2017) 5 mL 0   No facility-administered medications prior to visit.     Review of Systems  Constitutional: Negative for chills, fever, malaise/fatigue and weight loss.  HENT: Negative for ear discharge, ear pain and sore throat.   Eyes: Negative for blurred vision.  Respiratory: Negative for cough, sputum production, shortness of breath and wheezing.   Cardiovascular: Negative for chest pain, palpitations and leg swelling.  Gastrointestinal: Negative for abdominal pain, anorexia, blood in stool, constipation, diarrhea, heartburn, melena, nausea and vomiting.  Genitourinary: Negative for dysuria, flank pain, frequency, hematuria, missed menses, pelvic pain, urgency and vaginal discharge.  Musculoskeletal: Negative for back pain, joint pain, myalgias, neck pain and stiffness.  Skin: Negative for itching and rash.  Neurological: Negative for dizziness, tingling, sensory change, focal weakness, numbness and headaches.  Endo/Heme/Allergies: Negative for environmental allergies and polydipsia. Does not bruise/bleed easily.  Psychiatric/Behavioral: Negative for depression and suicidal ideas. The patient is not nervous/anxious and does not have insomnia.      Objective  Vitals:    06/05/17 0941  BP: 120/70  Pulse: 60  Weight: 160 lb (72.6 kg)  Height: 5\' 6"  (1.676 m)    Physical Exam  Constitutional: She is well-developed, well-nourished, and in no distress. No distress.  HENT:  Head: Normocephalic and atraumatic.  Right Ear: External ear normal.  Left Ear: External ear normal.  Nose: Nose normal.  Mouth/Throat: Oropharynx is clear and moist.  Eyes: Conjunctivae and EOM are normal. Pupils are equal, round, and reactive to light. Right eye exhibits no discharge. Left eye exhibits no discharge.  Neck: Normal range of motion. Neck supple. No JVD present. No thyromegaly present.  Cardiovascular: Normal rate, regular rhythm, normal heart sounds and intact distal pulses. Exam reveals no gallop and no friction rub.  No murmur heard. Pulmonary/Chest: Effort normal and breath sounds normal. She has no wheezes. She has no rales.  Abdominal: Soft. Bowel sounds are normal. She exhibits no mass. There is no tenderness. There is no guarding.  Musculoskeletal: She exhibits no edema.       Left shoulder: She exhibits decreased range  of motion and tenderness.       Left hip: She exhibits tenderness. She exhibits normal range of motion and normal strength.       Arms:      Legs: Lymphadenopathy:    She has no cervical adenopathy.  Neurological: She is alert. She has normal reflexes.  Skin: Skin is warm and dry. She is not diaphoretic.  Psychiatric: Mood and affect normal.  Nursing note and vitals reviewed.     Assessment & Plan  Problem List Items Addressed This Visit    None    Visit Diagnoses    Disorder of left rotator cuff    -  Primary   otc nsaid   Deep left inguinal pain       ? previos hernia reair/2006/ ? mesh   Relevant Orders   Ambulatory referral to General Surgery   Colon cancer screening       Relevant Orders   Ambulatory referral to Gastroenterology      No orders of the defined types were placed in this encounter.     Dr. [facility]  06/05/17

## 2017-07-15 ENCOUNTER — Ambulatory Visit: Payer: Federal, State, Local not specified - PPO | Admitting: Obstetrics and Gynecology

## 2017-07-15 ENCOUNTER — Encounter: Payer: Self-pay | Admitting: Obstetrics and Gynecology

## 2017-07-15 VITALS — BP 128/74 | Ht 66.0 in | Wt 160.0 lb

## 2017-07-15 DIAGNOSIS — C541 Malignant neoplasm of endometrium: Secondary | ICD-10-CM | POA: Diagnosis not present

## 2017-07-15 NOTE — Progress Notes (Signed)
Obstetrics & Gynecology Office Visit   Chief Complaint  Patient presents with  . Follow-up  Endometrial cancer surveillance  History of Present Illness: 57 y.o. G2P2002 female who was diagnosed with endometrial cancer in 09/2014.  Her final pathology showed a focus of grade 1 endometrioid adenocarcinoma in a background of EIN in a fragment of polyp during her hysterectomy. The cancer did not invade the myometrium. She has been presenting for every 6 month surveillance since that time.  She denies headaches, cough, weight changes (she continues to intentionally lose weight), bloating, constipation, early satiety, and vaginal bleeding.   Past Medical History:  Diagnosis Date  . Cancer ([place])    endometrial ca newly dx 6.2016  . Endometrial cancer ([place])   . GERD (gastroesophageal reflux disease)   . Migraine   . Sleep apnea     Past Surgical History:  Procedure Laterality Date  . CERVICAL POLYPECTOMY    . INGUINAL HERNIA REPAIR Left   . LAPAROSCOPIC HYSTERECTOMY N/A 09/27/2014   Procedure: HYSTERECTOMY TOTAL LAPAROSCOPIC;  Surgeon: Prentice Docker, MD;  Location: [facility];  Service: Gynecology;  Laterality: N/A;  . LAPAROTOMY N/A 09/27/2014   Procedure: LAPAROTOMY;  Surgeon: Mellody Drown, MD;  Location: [facility];  Service: Gynecology;  Laterality: N/A;  . LYMPH NODE BIOPSY N/A 09/27/2014   Procedure: LYMPH NODE BIOPSY;  Surgeon: Mellody Drown, MD;  Location: [facility];  Service: Gynecology;  Laterality: N/A;  . MYOMECTOMY    . PELVIC LYMPH NODE DISSECTION N/A 09/27/2014   Procedure: PELVIC LYMPH NODE DISSECTION;  Surgeon: Mellody Drown, MD;  Location: [facility];  Service: Gynecology;  Laterality: N/A;    Gynecologic History: No LMP recorded. Patient has had a hysterectomy.  Obstetric History: [id number]  Family History  Problem Relation Age of Onset  . Deep vein thrombosis Mother   . Heart attack Mother   . Diabetes Mother   . Diabetes Brother   . Diabetes Sister   . Diabetes  Maternal Aunt   . Colon cancer Father     Social History   Socioeconomic History  . Marital status: Married    Spouse name: Not on file  . Number of children: Not on file  . Years of education: Not on file  . Highest education level: Not on file  Occupational History  . Not on file  Social Needs  . Financial resource strain: Not on file  . Food insecurity:    Worry: Not on file    Inability: Not on file  . Transportation needs:    Medical: Not on file    Non-medical: Not on file  Tobacco Use  . Smoking status: Never Smoker  . Smokeless tobacco: Never Used  . Tobacco comment: 2 cigarettes a week, during college. Smoke for 4 years during college  Substance and Sexual Activity  . Alcohol use: Yes    Alcohol/week: 0.6 oz    Types: 1 Glasses of wine per week    Comment: glass a day  . Drug use: No  . Sexual activity: Not on file  Lifestyle  . Physical activity:    Days per week: Not on file    Minutes per session: Not on file  . Stress: Not on file  Relationships  . Social connections:    Talks on phone: Not on file    Gets together: Not on file    Attends religious service: Not on file    Active member of club or organization: Not on file  Attends meetings of clubs or organizations: Not on file    Relationship status: Not on file  . Intimate partner violence:    Fear of current or ex partner: Not on file    Emotionally abused: Not on file    Physically abused: Not on file    Forced sexual activity: Not on file  Other Topics Concern  . Not on file  Social History Narrative  . Not on file    Allergies  Allergen Reactions  . Codeine Other (See Comments)    Vasovagal response- hypotensive  . Sulfa Antibiotics     Prior to Admission medications   Not on File    Review of Systems  Constitutional: Negative.   HENT: Negative.   Eyes: Negative.   Respiratory: Negative.   Cardiovascular: Negative.   Gastrointestinal: Negative.   Genitourinary: Negative.    Musculoskeletal: Negative.   Skin: Negative.   Neurological: Negative.   Psychiatric/Behavioral: Negative.      Physical Exam BP 128/74   Ht 5\' 6"  (1.676 m)   Wt 160 lb (72.6 kg)   BMI 25.82 kg/m  No LMP recorded. Patient has had a hysterectomy. Physical Exam  Constitutional: She is oriented to person, place, and time. She appears well-developed and well-nourished. No distress.  Genitourinary: Pelvic exam was performed with patient supine. There is no rash, tenderness, lesion or injury on the right labia. There is no rash, tenderness, lesion or injury on the left labia. No erythema, tenderness or bleeding in the vagina. No signs of injury around the vagina. No vaginal discharge found.  Right adnexum absent. Right adnexum does not display mass, does not display tenderness and does not display fullness.  Left adnexum absent. Left adnexum does not display mass, does not display tenderness and does not display fullness.  Genitourinary Comments: Uterus and cervix surgically absent. No abnormal lesions noted along vaginal cuff. No nodules or lesions palpated.   HENT:  Head: Normocephalic and atraumatic.  Eyes: EOM are normal. No scleral icterus.  Neck: Normal range of motion. Neck supple. No thyromegaly present.  Cardiovascular: Normal rate and regular rhythm. Exam reveals no gallop and no friction rub.  No murmur heard. Pulmonary/Chest: Effort normal and breath sounds normal. No respiratory distress. She has no wheezes. She has no rales. Right breast exhibits no inverted nipple, no mass, no nipple discharge, no skin change and no tenderness. Left breast exhibits no inverted nipple, no mass, no nipple discharge, no skin change and no tenderness.  Abdominal: Soft. Bowel sounds are normal. She exhibits no distension and no mass. There is no tenderness. There is no rebound and no guarding.  Musculoskeletal: Normal range of motion. She exhibits no edema or tenderness.  Lymphadenopathy:    She has  no cervical adenopathy.       Right: No inguinal adenopathy present.       Left: No inguinal adenopathy present.  Neurological: She is alert and oriented to person, place, and time. No cranial nerve deficit.  Skin: Skin is warm and dry. No rash noted. No erythema.  Psychiatric: She has a normal mood and affect. Her behavior is normal. Judgment normal.    Female chaperone present for pelvic and breast  portions of the physical exam  Assessment: 57 y.o. G67P2002 female here for  1. Endometrial cancer [facility])      Plan: Problem List Items Addressed This Visit      Genitourinary   Endometrial cancer ([place]) - Primary     Continue endometrial  cancer surveillance.  Patient is now nearly three years out from her surgery (09/2014).  No evidence of recurrence of concerning symptoms or signs today.  Will have her return for her routine annual in 6-8 months and will re-assess at that time. If still appears cancer-free, can perform yearly exams until she is at least five years from the surgery.    20 minutes spent in face to face discussion with > 50% spent in counseling,management, and coordination of care of her endometrial cancer.   Prentice Docker, MD 07/15/2017 1:25 PM

## 2017-07-22 DIAGNOSIS — Z1211 Encounter for screening for malignant neoplasm of colon: Secondary | ICD-10-CM | POA: Diagnosis not present

## 2017-07-22 DIAGNOSIS — Z800 Family history of malignant neoplasm of digestive organs: Secondary | ICD-10-CM | POA: Diagnosis not present

## 2018-03-16 ENCOUNTER — Encounter: Payer: Self-pay | Admitting: Obstetrics and Gynecology

## 2018-03-16 ENCOUNTER — Other Ambulatory Visit: Payer: Self-pay

## 2018-03-16 ENCOUNTER — Ambulatory Visit (INDEPENDENT_AMBULATORY_CARE_PROVIDER_SITE_OTHER): Payer: Federal, State, Local not specified - PPO | Admitting: Obstetrics and Gynecology

## 2018-03-16 VITALS — BP 124/70 | Ht 67.0 in | Wt 169.0 lb

## 2018-03-16 DIAGNOSIS — Z1331 Encounter for screening for depression: Secondary | ICD-10-CM

## 2018-03-16 DIAGNOSIS — Z01419 Encounter for gynecological examination (general) (routine) without abnormal findings: Secondary | ICD-10-CM

## 2018-03-16 DIAGNOSIS — C541 Malignant neoplasm of endometrium: Secondary | ICD-10-CM | POA: Diagnosis not present

## 2018-03-16 DIAGNOSIS — Z1339 Encounter for screening examination for other mental health and behavioral disorders: Secondary | ICD-10-CM

## 2018-03-16 NOTE — Progress Notes (Signed)
Routine Annual Gynecology Examination   PCP: Juline Patch, MD  Chief Complaint  Patient presents with  . Annual Exam    History of Present Illness: Patient is a 57 y.o. G2P2002 presents for annual exam. The patient has no complaints today.   Menopausal bleeding: denies  Menopausal symptoms: hot flashes  Breast symptoms: reports  Last pap smear:  N/a. S/p hysterectomy  Last mammogram: 2 years ago.  Result Normal  She was diagnosed with endometrial cancer in 09/2014.  Her final pathology showed a focus of grade 1 endometrioid adenocarcinoma in a background of EIN in a fragment of polyp during her hysterectomy. The cancer did not invade the myometrium. She has been presenting for every 6 month surveillance since that time.  She denies headaches, cough, weight changes (she continues to intentionally lose weight), bloating, constipation, early satiety, and vaginal bleeding.   Past Medical History:  Diagnosis Date  . Cancer ([place])    endometrial ca newly dx 6.2016  . Endometrial cancer ([place])   . GERD (gastroesophageal reflux disease)   . Migraine   . Sleep apnea     Past Surgical History:  Procedure Laterality Date  . CERVICAL POLYPECTOMY    . INGUINAL HERNIA REPAIR Left   . LAPAROSCOPIC HYSTERECTOMY N/A 09/27/2014   Procedure: HYSTERECTOMY TOTAL LAPAROSCOPIC;  Surgeon: Prentice Docker, MD;  Location: [facility];  Service: Gynecology;  Laterality: N/A;  . LAPAROTOMY N/A 09/27/2014   Procedure: LAPAROTOMY;  Surgeon: Mellody Drown, MD;  Location: [facility];  Service: Gynecology;  Laterality: N/A;  . LYMPH NODE BIOPSY N/A 09/27/2014   Procedure: LYMPH NODE BIOPSY;  Surgeon: Mellody Drown, MD;  Location: [facility];  Service: Gynecology;  Laterality: N/A;  . MYOMECTOMY    . PELVIC LYMPH NODE DISSECTION N/A 09/27/2014   Procedure: PELVIC LYMPH NODE DISSECTION;  Surgeon: Mellody Drown, MD;  Location: [facility];  Service: Gynecology;  Laterality: N/A;    Prior to Admission  medications   denies    Allergies  Allergen Reactions  . Codeine Other (See Comments)    Vasovagal response- hypotensive  . Penicillins   . Sulfa Antibiotics    Obstetric History: [id number]  Social History   Socioeconomic History  . Marital status: Married    Spouse name: Not on file  . Number of children: Not on file  . Years of education: Not on file  . Highest education level: Not on file  Occupational History  . Not on file  Social Needs  . Financial resource strain: Not on file  . Food insecurity:    Worry: Not on file    Inability: Not on file  . Transportation needs:    Medical: Not on file    Non-medical: Not on file  Tobacco Use  . Smoking status: Never Smoker  . Smokeless tobacco: Never Used  . Tobacco comment: 2 cigarettes a week, during college. Smoke for 4 years during college  Substance and Sexual Activity  . Alcohol use: Yes    Alcohol/week: 1.0 standard drinks    Types: 1 Glasses of wine per week    Comment: glass a day  . Drug use: No  . Sexual activity: Not on file  Lifestyle  . Physical activity:    Days per week: Not on file    Minutes per session: Not on file  . Stress: Not on file  Relationships  . Social connections:    Talks on phone: Not on file    Gets together: Not  on file    Attends religious service: Not on file    Active member of club or organization: Not on file    Attends meetings of clubs or organizations: Not on file    Relationship status: Not on file  . Intimate partner violence:    Fear of current or ex partner: Not on file    Emotionally abused: Not on file    Physically abused: Not on file    Forced sexual activity: Not on file  Other Topics Concern  . Not on file  Social History Narrative  . Not on file    Family History  Problem Relation Age of Onset  . Deep vein thrombosis Mother   . Heart attack Mother   . Diabetes Mother   . Diabetes Brother   . Diabetes Sister   . Diabetes Maternal Aunt   . Colon  cancer Father     Review of Systems  Constitutional: Negative.   HENT: Negative.   Eyes: Negative.   Respiratory: Negative.   Cardiovascular: Negative.   Gastrointestinal: Negative.   Genitourinary: Negative.   Musculoskeletal: Negative.   Skin: Negative.   Neurological: Negative.   Psychiatric/Behavioral: Negative.      Physical Exam Vitals: BP 124/70   Ht 5\' 7"  (1.702 m)   Wt 169 lb (76.7 kg)   BMI 26.47 kg/m   Physical Exam  Constitutional: She is oriented to person, place, and time. She appears well-developed and well-nourished. No distress.  Genitourinary: Pelvic exam was performed with patient supine. There is no rash, tenderness, lesion or injury on the right labia. There is no rash, tenderness, lesion or injury on the left labia. No erythema, tenderness or bleeding in the vagina. No signs of injury around the vagina. No vaginal discharge found. Right adnexum does not display mass, does not display tenderness and does not display fullness. Left adnexum does not display mass, does not display tenderness and does not display fullness. Uterus is not exhibiting a mass.  Genitourinary Comments: Uterus and cervix surgically absent.  No evidence of recurrence. No nodules at vaginal cuff. No pelvic fullness  HENT:  Head: Normocephalic and atraumatic.  Eyes: EOM are normal. No scleral icterus.  Neck: Normal range of motion. Neck supple. No thyromegaly present.  Cardiovascular: Normal rate and regular rhythm. Exam reveals no gallop and no friction rub.  No murmur heard. Pulmonary/Chest: Effort normal and breath sounds normal. No respiratory distress. She has no wheezes. She has no rales. Right breast exhibits no inverted nipple, no mass, no nipple discharge, no skin change and no tenderness. Left breast exhibits no inverted nipple, no mass, no nipple discharge, no skin change and no tenderness.  Abdominal: Soft. Bowel sounds are normal. She exhibits no distension and no mass. There is  no tenderness. There is no rebound and no guarding.  Musculoskeletal: Normal range of motion. She exhibits no edema or tenderness.  Lymphadenopathy:    She has no cervical adenopathy.       Right: No inguinal adenopathy present.       Left: No inguinal adenopathy present.  Neurological: She is alert and oriented to person, place, and time. No cranial nerve deficit.  Skin: Skin is warm and dry. No rash noted. No erythema.  Psychiatric: She has a normal mood and affect. Her behavior is normal. Judgment normal.     Female chaperone present for pelvic and breast  portions of the physical exam  Results: AUDIT Questionnaire (screen for alcoholism): 4 PHQ-9: 0  Assessment and Plan:  57 y.o. G39P2002 female here for routine annual gynecologic examination  Plan: Problem List Items Addressed This Visit      Genitourinary   Endometrial cancer ([place])    Other Visit Diagnoses    Women's annual routine gynecological examination    -  Primary   Screening for depression       Screening for alcoholism          Screening: -- Blood pressure screen normal -- Colonoscopy - not due -- Mammogram - due. Patient to call [place] to arrange. She understands that it is her responsibility to arrange this. -- Weight screening: normal -- Depression screening negative (PHQ-9) -- Nutrition: normal -- cholesterol screening: per PCP -- osteoporosis screening: not due -- tobacco screening: not using -- alcohol screening: AUDIT questionnaire indicates low-risk usage. -- family history of breast cancer screening: done. not at high risk. -- no evidence of domestic violence or intimate partner violence. -- STD screening: gonorrhea/chlamydia NAAT not collected per patient request. -- pap smear not collected per ASCCP guidelines -- flu vaccine declines every year -- HPV vaccination series: not eligilbe   History of endometrial cancer: no evidence of recurrence today. Patient counseled about warning signs  of recurrence. Will extend the surveillance interval to 1 year. So, will see her in one year.  15 minutes spent in face to face discussion with > 50% spent in counseling,management, and coordination of care of her endometrial cancer and surveillance.    Prentice Docker, MD 03/16/2018 11:17 AM

## 2018-05-19 DIAGNOSIS — K080 Exfoliation of teeth due to systemic causes: Secondary | ICD-10-CM | POA: Diagnosis not present

## 2018-05-20 DIAGNOSIS — K080 Exfoliation of teeth due to systemic causes: Secondary | ICD-10-CM | POA: Diagnosis not present

## 2018-05-27 DIAGNOSIS — L570 Actinic keratosis: Secondary | ICD-10-CM | POA: Diagnosis not present

## 2018-11-25 DIAGNOSIS — K036 Deposits [accretions] on teeth: Secondary | ICD-10-CM | POA: Diagnosis not present

## 2018-11-25 DIAGNOSIS — K05322 Chronic periodontitis, generalized, moderate: Secondary | ICD-10-CM | POA: Diagnosis not present

## 2018-11-25 DIAGNOSIS — Z1281 Encounter for screening for malignant neoplasm of oral cavity: Secondary | ICD-10-CM | POA: Diagnosis not present

## 2019-01-04 DIAGNOSIS — Z872 Personal history of diseases of the skin and subcutaneous tissue: Secondary | ICD-10-CM | POA: Diagnosis not present

## 2019-01-04 DIAGNOSIS — Z86018 Personal history of other benign neoplasm: Secondary | ICD-10-CM | POA: Diagnosis not present

## 2019-01-04 DIAGNOSIS — L578 Other skin changes due to chronic exposure to nonionizing radiation: Secondary | ICD-10-CM | POA: Diagnosis not present

## 2019-01-04 DIAGNOSIS — D239 Other benign neoplasm of skin, unspecified: Secondary | ICD-10-CM | POA: Diagnosis not present

## 2019-04-14 ENCOUNTER — Ambulatory Visit (INDEPENDENT_AMBULATORY_CARE_PROVIDER_SITE_OTHER): Payer: Federal, State, Local not specified - PPO | Admitting: Obstetrics and Gynecology

## 2019-04-14 ENCOUNTER — Other Ambulatory Visit: Payer: Self-pay

## 2019-04-14 ENCOUNTER — Encounter: Payer: Self-pay | Admitting: Obstetrics and Gynecology

## 2019-04-14 VITALS — BP 140/71 | Ht 67.0 in | Wt 171.0 lb

## 2019-04-14 DIAGNOSIS — Z1339 Encounter for screening examination for other mental health and behavioral disorders: Secondary | ICD-10-CM

## 2019-04-14 DIAGNOSIS — Z1331 Encounter for screening for depression: Secondary | ICD-10-CM

## 2019-04-14 DIAGNOSIS — Z01419 Encounter for gynecological examination (general) (routine) without abnormal findings: Secondary | ICD-10-CM

## 2019-04-14 DIAGNOSIS — Z1231 Encounter for screening mammogram for malignant neoplasm of breast: Secondary | ICD-10-CM

## 2019-04-14 DIAGNOSIS — C541 Malignant neoplasm of endometrium: Secondary | ICD-10-CM

## 2019-04-14 NOTE — Progress Notes (Signed)
Routine Annual Gynecology Examination   PCP: Juline Patch, MD  Chief Complaint  Patient presents with  . Gynecologic Exam    History of Present Illness: Patient is a 59 y.o. G2P2002 presents for annual exam. The patient has no complaints today.   Menopausal bleeding: denies  Menopausal symptoms: hot flashes  Breast symptoms: denies  Last pap smear:  S/p hysterectomy  Last mammogram: 3 years ago.  Result Normal   Colonoscopy: 2019  She was diagnosed with endometrial cancer in 09/2014. Her final pathology showed a focus of grade 1 endometrioid adenocarcinoma in a background of EIN in a fragment of polyp during her hysterectomy. The cancer did not invade the myometrium. She has been presenting for every 6 month surveillance since that time. Her last surveillance was one year ago.She some headaches occasional headaches between Thanksgiving and Christmas. She denies any recent headaches.  She does deny cough, weight changes (she continues to intentionally lose weight), bloating, constipation, early satiety, and vaginal bleeding.   Past Medical History:  Diagnosis Date  . Cancer ([place])    endometrial ca newly dx 6.2016  . Endometrial cancer ([place])   . GERD (gastroesophageal reflux disease)   . Migraine   . Sleep apnea     Past Surgical History:  Procedure Laterality Date  . CERVICAL POLYPECTOMY    . INGUINAL HERNIA REPAIR Left   . LAPAROSCOPIC HYSTERECTOMY N/A 09/27/2014   Procedure: HYSTERECTOMY TOTAL LAPAROSCOPIC;  Surgeon: Prentice Docker, MD;  Location: [facility] ORS;  Service: Gynecology;  Laterality: N/A;  . LAPAROTOMY N/A 09/27/2014   Procedure: LAPAROTOMY;  Surgeon: Mellody Drown, MD;  Location: [facility];  Service: Gynecology;  Laterality: N/A;  . LYMPH NODE BIOPSY N/A 09/27/2014   Procedure: LYMPH NODE BIOPSY;  Surgeon: Mellody Drown, MD;  Location: [facility];  Service: Gynecology;  Laterality: N/A;  . MYOMECTOMY    . PELVIC LYMPH NODE DISSECTION N/A 09/27/2014    Procedure: PELVIC LYMPH NODE DISSECTION;  Surgeon: Mellody Drown, MD;  Location: [facility];  Service: Gynecology;  Laterality: N/A;    Prior to Admission medications   denies    Allergies  Allergen Reactions  . Codeine Other (See Comments)    Vasovagal response- hypotensive  . Penicillins   . Sulfa Antibiotics     Obstetric History: [id number]  Social History   Socioeconomic History  . Marital status: Married    Spouse name: Not on file  . Number of children: Not on file  . Years of education: Not on file  . Highest education level: Not on file  Occupational History  . Not on file  Tobacco Use  . Smoking status: Never Smoker  . Smokeless tobacco: Never Used  . Tobacco comment: 2 cigarettes a week, during college. Smoke for 4 years during college  Substance and Sexual Activity  . Alcohol use: Yes    Alcohol/week: 1.0 standard drinks    Types: 1 Glasses of wine per week    Comment: glass a day  . Drug use: No  . Sexual activity: Yes    Birth control/protection: Surgical    Comment: Hysterectomy  Other Topics Concern  . Not on file  Social History Narrative  . Not on file   Social Determinants of Health   Financial Resource Strain:   . Difficulty of Paying Living Expenses: Not on file  Food Insecurity:   . Worried About Charity fundraiser in the Last Year: Not on file  . Ran Out of Food in  the Last Year: Not on file  Transportation Needs:   . Lack of Transportation (Medical): Not on file  . Lack of Transportation (Non-Medical): Not on file  Physical Activity:   . Days of Exercise per Week: Not on file  . Minutes of Exercise per Session: Not on file  Stress:   . Feeling of Stress : Not on file  Social Connections:   . Frequency of Communication with Friends and Family: Not on file  . Frequency of Social Gatherings with Friends and Family: Not on file  . Attends Religious Services: Not on file  . Active Member of Clubs or Organizations: Not on file  .  Attends Archivist Meetings: Not on file  . Marital Status: Not on file  Intimate Partner Violence:   . Fear of Current or Ex-Partner: Not on file  . Emotionally Abused: Not on file  . Physically Abused: Not on file  . Sexually Abused: Not on file    Family History  Problem Relation Age of Onset  . Deep vein thrombosis Mother   . Heart attack Mother   . Diabetes Mother   . Diabetes Brother   . Diabetes Sister   . Diabetes Maternal Aunt   . Colon cancer Father     Review of Systems  Constitutional: Negative.        Hot flashes  HENT: Negative.   Eyes: Negative.   Respiratory: Negative.   Cardiovascular: Negative.   Gastrointestinal: Negative.   Genitourinary: Negative.   Musculoskeletal: Negative.   Skin: Negative.   Neurological: Negative.   Psychiatric/Behavioral: Negative.      Physical Exam Vitals: BP 140/71   Ht 5\' 7"  (1.702 m)   Wt 171 lb (77.6 kg)   BMI 26.78 kg/m   Physical Exam Constitutional:      General: She is not in acute distress.    Appearance: Normal appearance. She is well-developed.  Genitourinary:     Pelvic exam was performed with patient in the lithotomy position.     Vulva, urethra and bladder normal.     No inguinal adenopathy present in the right or left side.    No signs of injury in the vagina.     No vaginal discharge, erythema, tenderness or bleeding.     Cervix is absent.     Uterus is absent.     Right and left adnexa are non-palpable.     No right or left adnexal mass present.     Right adnexa not tender or full.     Left adnexa not tender or full.  HENT:     Head: Normocephalic and atraumatic.  Eyes:     General: No scleral icterus.    Conjunctiva/sclera: Conjunctivae normal.  Neck:     Thyroid: No thyromegaly.  Cardiovascular:     Rate and Rhythm: Normal rate and regular rhythm.     Heart sounds: No murmur. No friction rub. No gallop.   Pulmonary:     Effort: Pulmonary effort is normal. No respiratory  distress.     Breath sounds: Normal breath sounds. No wheezing or rales.  Chest:     Breasts:        Right: No inverted nipple, mass, nipple discharge, skin change or tenderness.        Left: No inverted nipple, mass, nipple discharge, skin change or tenderness.  Abdominal:     General: Bowel sounds are normal. There is no distension.     Palpations: Abdomen is  soft. There is no mass.     Tenderness: There is no abdominal tenderness. There is no guarding or rebound.  Musculoskeletal:        General: No swelling or tenderness. Normal range of motion.     Cervical back: Normal range of motion and neck supple.  Lymphadenopathy:     Cervical: No cervical adenopathy.     Lower Body: No right inguinal adenopathy. No left inguinal adenopathy.  Neurological:     General: No focal deficit present.     Mental Status: She is alert and oriented to person, place, and time.     Cranial Nerves: No cranial nerve deficit.  Skin:    General: Skin is warm and dry.     Findings: No erythema or rash.  Psychiatric:        Mood and Affect: Mood normal.        Behavior: Behavior normal.        Judgment: Judgment normal.      Female chaperone present for pelvic and breast  portions of the physical exam  Results: AUDIT Questionnaire (screen for alcoholism): 1 PHQ-9: 3   Assessment and Plan:  59 y.o. G32P2002 female here for routine annual gynecologic examination  Plan: Problem List Items Addressed This Visit      Genitourinary   Endometrial cancer ([place])    Other Visit Diagnoses    Screening for depression    -  Primary   Screening for alcoholism       Women's annual routine gynecological examination       Encounter for screening mammogram for malignant neoplasm of breast          Screening: -- Blood pressure screen elevated: continued to monitor. -- Colonoscopy - not due -- Mammogram - due. Patient to call [place] to arrange. She understands that it is her responsibility to arrange  this. The patient was strongly encouraged to arrange this.  -- Weight screening: normal -- Depression screening negative (PHQ-9) -- Nutrition: normal -- cholesterol screening: per PCP -- osteoporosis screening: not due -- tobacco screening: not using -- alcohol screening: AUDIT questionnaire indicates low-risk usage. -- family history of breast cancer screening: done. not at high risk. -- no evidence of domestic violence or intimate partner violence. -- STD screening: gonorrhea/chlamydia NAAT not collected per patient request. -- pap smear not collected per ASCCP guidelines -- flu vaccine received this year -- HPV vaccination series: not eligilbe   Hot flashes: discussed different medication management strategies. She will consider.  Does not wish to start anything today.   History of endometrial cancer: no evidence of recurrence today. Patient counseled about warning signs of recurrence. So, will see her in one year.  Prentice Docker, MD 04/14/2019 9:04 AM

## 2019-06-07 DIAGNOSIS — K036 Deposits [accretions] on teeth: Secondary | ICD-10-CM | POA: Diagnosis not present

## 2019-06-07 DIAGNOSIS — Z1281 Encounter for screening for malignant neoplasm of oral cavity: Secondary | ICD-10-CM | POA: Diagnosis not present

## 2019-06-07 DIAGNOSIS — K05322 Chronic periodontitis, generalized, moderate: Secondary | ICD-10-CM | POA: Diagnosis not present

## 2019-06-23 ENCOUNTER — Ambulatory Visit: Payer: Federal, State, Local not specified - PPO | Attending: Internal Medicine

## 2019-06-23 DIAGNOSIS — Z23 Encounter for immunization: Secondary | ICD-10-CM

## 2019-06-23 NOTE — Progress Notes (Signed)
   Covid-19 Vaccination Clinic  Name:  Daiva Siner    MRN: [id number] DOB: [birth year]  06/23/2019  Ms. Rasmusson was observed post Covid-19 immunization for 15 minutes without incident. She was provided with Vaccine Information Sheet and instruction to access the V-Safe system.   Ms. Varas was instructed to call 911 with any severe reactions post vaccine: Marland Kitchen Difficulty breathing  . Swelling of face and throat  . A fast heartbeat  . A bad rash all over body  . Dizziness and weakness   Immunizations Administered    Name Date Dose VIS Date Route   Pfizer COVID-19 Vaccine 06/23/2019 10:49 AM 0.3 mL 03/18/2019 Intramuscular   Manufacturer: [place]   Lot: [id number]   [place]: [id number]

## 2019-07-20 ENCOUNTER — Ambulatory Visit: Payer: Federal, State, Local not specified - PPO | Attending: Internal Medicine

## 2019-07-20 DIAGNOSIS — Z23 Encounter for immunization: Secondary | ICD-10-CM

## 2019-07-20 NOTE — Progress Notes (Signed)
   Covid-19 Vaccination Clinic  Name:  Jairy Gilhooley    MRN: [id number] DOB: 1960/06/15  07/20/2019  Ms. Takacs was observed post Covid-19 immunization for 15 minutes without incident. She was provided with Vaccine Information Sheet and instruction to access the V-Safe system.   Ms. Hosea was instructed to call 911 with any severe reactions post vaccine: Marland Kitchen Difficulty breathing  . Swelling of face and throat  . A fast heartbeat  . A bad rash all over body  . Dizziness and weakness   Immunizations Administered    Name Date Dose VIS Date Route   Pfizer COVID-19 Vaccine 07/20/2019 10:14 AM 0.3 mL 03/18/2019 Intramuscular   Manufacturer: [place]   Lot: [id number]   [place]: [id number]

## 2019-08-08 DIAGNOSIS — L255 Unspecified contact dermatitis due to plants, except food: Secondary | ICD-10-CM | POA: Diagnosis not present

## 2019-08-08 DIAGNOSIS — B353 Tinea pedis: Secondary | ICD-10-CM | POA: Diagnosis not present

## 2019-08-08 DIAGNOSIS — L720 Epidermal cyst: Secondary | ICD-10-CM | POA: Diagnosis not present

## 2019-08-23 ENCOUNTER — Other Ambulatory Visit: Payer: Self-pay

## 2019-08-23 ENCOUNTER — Ambulatory Visit: Payer: Federal, State, Local not specified - PPO | Admitting: Family Medicine

## 2019-08-23 ENCOUNTER — Encounter: Payer: Self-pay | Admitting: Family Medicine

## 2019-08-23 VITALS — BP 124/72 | HR 80 | Ht 67.0 in | Wt 173.0 lb

## 2019-08-23 DIAGNOSIS — G518 Other disorders of facial nerve: Secondary | ICD-10-CM

## 2019-08-23 DIAGNOSIS — L309 Dermatitis, unspecified: Secondary | ICD-10-CM

## 2019-08-23 DIAGNOSIS — L040 Acute lymphadenitis of face, head and neck: Secondary | ICD-10-CM | POA: Diagnosis not present

## 2019-08-23 MED ORDER — DOXYCYCLINE HYCLATE 100 MG PO TABS: 100.0000 mg | ORAL_TABLET | Freq: Two times a day (BID) | ORAL | 0 refills | Status: DC

## 2019-08-23 NOTE — Patient Instructions (Signed)
Trigeminal Neuralgia  Trigeminal neuralgia is a nerve disorder that causes severe pain on one side of the face. The pain may last from a few seconds to several minutes. The pain is usually only on one side of the face. Symptoms may occur for days, weeks, or months and then go away for months or years. The pain may return and be worse than before. What are the causes? This condition is caused by damage or pressure to a nerve in the head that is called the trigeminal nerve. An attack can be triggered by:  Talking.  Chewing.  Putting on makeup.  Washing your face.  Shaving your face.  Brushing your teeth.  Touching your face. What increases the risk? You are more likely to develop this condition if you:  Are 50 years of age or older.  Are female. What are the signs or symptoms? The main symptom of this condition is severe pain in the:  Jaw.  Lips.  Eyes.  Nose.  Scalp.  Forehead.  Face. The pain may be:  Intense.  Stabbing.  Electric.  Shock-like. How is this diagnosed? This condition is diagnosed with a physical exam. A CT scan or an MRI may be done to rule out other conditions that can cause facial pain. How is this treated? This condition may be treated with:  Avoiding the things that trigger your symptoms.  Taking prescription medicines (anticonvulsants).  Having surgery. This may be done in severe cases if other medical treatment does not provide relief.  Having procedures such as ablation, thermal, or radiation therapy. It may take up to one month for treatment to start relieving the pain. Follow these instructions at home: Managing pain  Learn as much as you can about how to manage your pain. Ask your health care provider if a pain specialist would be helpful.  Consider talking with a mental health care provider (psychologist) about how to cope with the pain.  Consider joining a pain support group. General instructions  Take  over-the-counter and prescription medicines only as told by your health care provider.  Avoid the things that trigger your symptoms. It may help to: ? Chew on the unaffected side of your mouth. ? Avoid touching your face. ? Avoid blasts of hot or cold air.  Follow your treatment plan as told by your health care provider. This may include: ? Cognitive or behavioral therapy. ? Gentle, regular exercise. ? Meditation or yoga. ? Aromatherapy.  Keep all follow-up visits as told by your health care provider. You may need to be monitored closely to make sure treatment is working well for you. Where to find more information  Facial Pain Association: fpa-support.org Contact a health care provider if:  Your medicine is not helping your symptoms.  You have side effects from the medicine used for treatment.  You develop new, unexplained symptoms, such as: ? Double vision. ? Facial weakness. ? Facial numbness. ? Changes in hearing or balance.  You feel depressed. Get help right away if:  Your pain is severe and is not getting better.  You develop suicidal thoughts. If you ever feel like you may hurt yourself or others, or have thoughts about taking your own life, get help right away. You can go to your nearest emergency department or call:  Your local emergency services (911 in the [place]  A suicide crisis helpline, such as the National Suicide Prevention Lifeline at 1-800-273-8255. This is open 24 hours a day. Summary  Trigeminal neuralgia is a   nerve disorder that causes severe pain on one side of the face. The pain may last from a few seconds to several minutes.  This condition is caused by damage or pressure to a nerve in the head that is called the trigeminal nerve.  Treatment may include avoiding the things that trigger your symptoms, taking medicines, or having surgery or procedures. It may take up to one month for treatment to start relieving the pain.  Avoid the things that  trigger your symptoms.  Keep all follow-up visits as told by your health care provider. You may need to be monitored closely to make sure treatment is working well for you. This information is not intended to replace advice given to you by your health care provider. Make sure you discuss any questions you have with your health care provider. Document Revised: 02/08/2018 Document Reviewed: 02/08/2018 Elsevier Patient Education  2020 Elsevier Inc.  

## 2019-08-23 NOTE — Progress Notes (Signed)
Date:  08/23/2019   Name:  Grace Sanchez   DOB:  29-Jan-1961   MRN:  [id number]   Chief Complaint: Mass (feels a lump on L) side of neck that is sore. sore across cheek in the skin)  Neck Pain  This is a new problem. The current episode started 1 to 4 weeks ago. The problem occurs constantly. The problem has been unchanged. The pain is present in the anterior neck and left side. The quality of the pain is described as aching. The pain is moderate. The pain is same all the time. Pertinent negatives include no chest pain, fever, headaches, numbness, photophobia or weakness.  Neurologic Problem The patient's primary symptoms include focal sensory loss. The patient's pertinent negatives include no weakness. Primary symptoms comment: left facial. This is a new problem. The current episode started in the past 7 days. The neurological problem developed gradually. There was left-sided and facial focality noted. Associated symptoms include neck pain. Pertinent negatives include no abdominal pain, back pain, chest pain, diaphoresis, dizziness, fatigue, fever, headaches, light-headedness, nausea, palpitations, shortness of breath or vomiting.    Lab Results  Component Value Date   CREATININE 0.77 01/12/2017   BUN 18 01/12/2017   NA 141 01/12/2017   K 4.3 01/12/2017   CL 100 01/12/2017   CO2 24 01/12/2017   Lab Results  Component Value Date   CHOL 176 01/12/2017   HDL 58 01/12/2017   LDLCALC 102 (H) 01/12/2017   TRIG 78 01/12/2017   Lab Results  Component Value Date   TSH 1.830 01/12/2017   Lab Results  Component Value Date   HGBA1C 5.3 01/12/2017   Lab Results  Component Value Date   WBC 7.5 01/12/2017   HGB 15.2 01/12/2017   HCT 44.5 01/12/2017   MCV 88 01/12/2017   PLT 298 01/12/2017   Lab Results  Component Value Date   ALT 11 01/12/2017   AST 15 01/12/2017   ALKPHOS 53 01/12/2017   BILITOT 0.6 01/12/2017     Review of Systems  Constitutional: Negative.  Negative for  chills, diaphoresis, fatigue, fever and unexpected weight change.  HENT: Negative for congestion, ear discharge, ear pain, nosebleeds, postnasal drip, rhinorrhea, sinus pressure, sneezing and sore throat.   Eyes: Negative for photophobia, pain, discharge, redness and itching.  Respiratory: Negative for cough, chest tightness, shortness of breath, wheezing and stridor.   Cardiovascular: Negative for chest pain, palpitations and leg swelling.  Gastrointestinal: Negative for abdominal pain, blood in stool, constipation, diarrhea, nausea and vomiting.  Endocrine: Negative for cold intolerance, heat intolerance, polydipsia, polyphagia and polyuria.  Genitourinary: Negative for dysuria, flank pain, frequency, hematuria, menstrual problem, pelvic pain, urgency, vaginal bleeding and vaginal discharge.  Musculoskeletal: Positive for neck pain. Negative for arthralgias, back pain and myalgias.  Skin: Negative for rash.  Allergic/Immunologic: Negative for environmental allergies and food allergies.  Neurological: Negative for dizziness, weakness, light-headedness, numbness and headaches.  Hematological: Negative for adenopathy. Does not bruise/bleed easily.  Psychiatric/Behavioral: Negative for dysphoric mood. The patient is not nervous/anxious.     Patient Active Problem List   Diagnosis Date Noted  . Endometrial cancer ([place]) 07/11/2016  . Status post hysterectomy 09/27/2014  . Endometrial intraepithelial neoplasia (EIN) 09/06/2014    Allergies  Allergen Reactions  . Codeine Other (See Comments)    Vasovagal response- hypotensive  . Penicillins   . Sulfa Antibiotics     Past Surgical History:  Procedure Laterality Date  . CERVICAL POLYPECTOMY    .  INGUINAL HERNIA REPAIR Left   . LAPAROSCOPIC HYSTERECTOMY N/A 09/27/2014   Procedure: HYSTERECTOMY TOTAL LAPAROSCOPIC;  Surgeon: Prentice Docker, MD;  Location: [facility];  Service: Gynecology;  Laterality: N/A;  . LAPAROTOMY N/A 09/27/2014    Procedure: LAPAROTOMY;  Surgeon: Mellody Drown, MD;  Location: [facility];  Service: Gynecology;  Laterality: N/A;  . LYMPH NODE BIOPSY N/A 09/27/2014   Procedure: LYMPH NODE BIOPSY;  Surgeon: Mellody Drown, MD;  Location: [facility];  Service: Gynecology;  Laterality: N/A;  . MYOMECTOMY    . PELVIC LYMPH NODE DISSECTION N/A 09/27/2014   Procedure: PELVIC LYMPH NODE DISSECTION;  Surgeon: Mellody Drown, MD;  Location: [facility] OR[facility];  Service: Gynecology;  Laterality: N/A;    Social History   Tobacco Use  . Smoking status: Never Smoker  . Smokeless tobacco: Never Used  . Tobacco comment: 2 cigarettes a week, during college. Smoke for 4 years during college  Substance Use Topics  . Alcohol use: Yes    Alcohol/week: 1.0 standard drinks    Types: 1 Glasses of wine per week    Comment: glass a day  . Drug use: No     Medication list has been reviewed and updated.  No outpatient medications have been marked as taking for the 08/23/19 encounter (Office Visit) with Juline Patch, MD.    [facility] 2/9 Scores 08/23/2019 04/14/2019 06/05/2017  PHQ - 2 Score 0 0 0  PHQ- 9 Score 0 1 0    BP Readings from Last 3 Encounters:  08/23/19 124/72  04/14/19 140/71  03/16/18 124/70    Physical Exam Vitals and nursing note reviewed.  Constitutional:      Appearance: She is well-developed.  HENT:     Head: Normocephalic.     Right Ear: Tympanic membrane, ear canal and external ear normal.     Left Ear: Tympanic membrane, ear canal and external ear normal.     Nose: Nose normal.  Eyes:     General: Lids are everted, no foreign bodies appreciated. No scleral icterus.       Left eye: No foreign body or hordeolum.     Conjunctiva/sclera: Conjunctivae normal.     Right eye: Right conjunctiva is not injected.     Left eye: Left conjunctiva is not injected.     Pupils: Pupils are equal, round, and reactive to light.  Neck:     Thyroid: No thyromegaly.     Vascular: No JVD.     Trachea: No tracheal  deviation.  Cardiovascular:     Rate and Rhythm: Normal rate and regular rhythm.     Heart sounds: Normal heart sounds. No murmur. No friction rub. No gallop.   Pulmonary:     Effort: Pulmonary effort is normal. No respiratory distress.     Breath sounds: Normal breath sounds. No wheezing, rhonchi or rales.  Abdominal:     General: Bowel sounds are normal.     Palpations: Abdomen is soft. There is no mass.     Tenderness: There is no abdominal tenderness. There is no guarding or rebound.  Musculoskeletal:        General: No tenderness. Normal range of motion.     Cervical back: Normal range of motion and neck supple.  Lymphadenopathy:     Cervical: No cervical adenopathy.  Skin:    General: Skin is warm.     Findings: No rash.  Neurological:     Mental Status: She is alert and oriented to person, place, and time.  Cranial Nerves: No cranial nerve deficit.     Deep Tendon Reflexes: Reflexes normal.  Psychiatric:        Mood and Affect: Mood is not anxious or depressed.     Wt Readings from Last 3 Encounters:  08/23/19 173 lb (78.5 kg)  04/14/19 171 lb (77.6 kg)  03/16/18 169 lb (76.7 kg)    BP 124/72   Pulse 80   Ht 5\' 7"  (1.702 m)   Wt 173 lb (78.5 kg)   SpO2 98%   BMI 27.10 kg/m   Assessment and Plan: 1. Acute cervical adenitis New onset.  Palpable anterior lymph node with some tenderness noted on the left side which is not specifically connected to the thyroid.  Patient has pulled some ticks off her on other areas it is conceivable that she may have had an insect bite either mosquito or tick that may have caused covering lymph node to be irritated.  We will treat with doxycycline 100 mg twice a day seen that the patient is going on a trip and if there is not complete resolution her next step is to perhaps do an ultrasound to check for adenopathy. - doxycycline (VIBRA-TABS) 100 MG tablet; Take 1 tablet (100 mg total) by mouth 2 (two) times daily.  Dispense: 20 tablet;  Refill: 0  2. Facial neuralgia Patient had some facial neuralgia that was on the left side that she noticed when she would put on make-up.  This is consistent with possible trigeminal neuralgia but for the most part we will keep an eye on this I do not think it is anything serious but if it continues our next step with to involve neurology to take a closer look for possibility of an early trigeminal neuralgia.    3.Dermatitis: Patient had a dermatitis on the upper inner aspect of her left arm for which she was on a potent corticosteroid cream.  There is been more or less a resolution of this but she describes blisters on not certain exactly what it is but we will contact Dr. Phillip Heal and I will review her notes so that I will be up-to-date with this.

## 2020-01-05 DIAGNOSIS — L578 Other skin changes due to chronic exposure to nonionizing radiation: Secondary | ICD-10-CM | POA: Diagnosis not present

## 2020-01-05 DIAGNOSIS — Z872 Personal history of diseases of the skin and subcutaneous tissue: Secondary | ICD-10-CM | POA: Diagnosis not present

## 2020-01-05 DIAGNOSIS — Z86018 Personal history of other benign neoplasm: Secondary | ICD-10-CM | POA: Diagnosis not present

## 2020-01-05 DIAGNOSIS — L821 Other seborrheic keratosis: Secondary | ICD-10-CM | POA: Diagnosis not present

## 2020-01-24 DIAGNOSIS — L02212 Cutaneous abscess of back [any part, except buttock]: Secondary | ICD-10-CM | POA: Diagnosis not present

## 2020-01-30 ENCOUNTER — Ambulatory Visit: Payer: Federal, State, Local not specified - PPO | Attending: Internal Medicine

## 2020-01-30 DIAGNOSIS — Z23 Encounter for immunization: Secondary | ICD-10-CM

## 2020-01-30 NOTE — Progress Notes (Addendum)
   Covid-19 Vaccination Clinic  Name:  Grace Sanchez    MRN: [id number] DOB: December 30, 1960  01/30/2020  Grace Sanchez was observed post Covid-19 immunization for 30 minutes based on pre-vaccination screening without incident. She was provided with Vaccine Information Sheet and instruction to access the V-Safe system.   Grace Sanchez was instructed to call 911 with any severe reactions post vaccine: Marland Kitchen Difficulty breathing  . Swelling of face and throat  . A fast heartbeat  . A bad rash all over body  . Dizziness and weakness

## 2020-04-03 ENCOUNTER — Encounter: Payer: Self-pay | Admitting: Family Medicine

## 2020-04-03 DIAGNOSIS — U071 COVID-19: Secondary | ICD-10-CM | POA: Diagnosis not present

## 2020-04-03 DIAGNOSIS — Z20822 Contact with and (suspected) exposure to covid-19: Secondary | ICD-10-CM | POA: Diagnosis not present

## 2020-08-15 ENCOUNTER — Telehealth: Payer: Self-pay

## 2020-08-15 NOTE — Telephone Encounter (Signed)
Pt calling; has appt c SDJ 5/18; needs order for mammogram send to the [facility] facility; has been over a yr since she has been seen.  [phone]  Adv pt it would be better to see SDJ first then have the order put in so the order will be more correct.

## 2020-08-22 ENCOUNTER — Other Ambulatory Visit: Payer: Self-pay | Admitting: Obstetrics and Gynecology

## 2020-08-22 ENCOUNTER — Other Ambulatory Visit: Payer: Self-pay

## 2020-08-22 ENCOUNTER — Ambulatory Visit (INDEPENDENT_AMBULATORY_CARE_PROVIDER_SITE_OTHER): Payer: Federal, State, Local not specified - PPO | Admitting: Obstetrics and Gynecology

## 2020-08-22 ENCOUNTER — Encounter: Payer: Self-pay | Admitting: Obstetrics and Gynecology

## 2020-08-22 VITALS — BP 144/83 | Ht 67.0 in | Wt 168.0 lb

## 2020-08-22 DIAGNOSIS — Z1231 Encounter for screening mammogram for malignant neoplasm of breast: Secondary | ICD-10-CM

## 2020-08-22 DIAGNOSIS — Z131 Encounter for screening for diabetes mellitus: Secondary | ICD-10-CM | POA: Diagnosis not present

## 2020-08-22 DIAGNOSIS — Z1339 Encounter for screening examination for other mental health and behavioral disorders: Secondary | ICD-10-CM | POA: Diagnosis not present

## 2020-08-22 DIAGNOSIS — Z1331 Encounter for screening for depression: Secondary | ICD-10-CM | POA: Diagnosis not present

## 2020-08-22 DIAGNOSIS — Z0000 Encounter for general adult medical examination without abnormal findings: Secondary | ICD-10-CM

## 2020-08-22 DIAGNOSIS — Z1329 Encounter for screening for other suspected endocrine disorder: Secondary | ICD-10-CM | POA: Diagnosis not present

## 2020-08-22 DIAGNOSIS — Z01419 Encounter for gynecological examination (general) (routine) without abnormal findings: Secondary | ICD-10-CM

## 2020-08-22 DIAGNOSIS — Z13220 Encounter for screening for lipoid disorders: Secondary | ICD-10-CM

## 2020-08-22 NOTE — Progress Notes (Signed)
Routine Annual Gynecology Examination   PCP: Juline Patch, MD  Chief Complaint  Patient presents with  . Annual Exam    History of Present Illness: Patient is a 60 y.o. [id number] presents for annual exam. The patient has no complaints today.   Menopausal bleeding: denies  Menopausal symptoms: hot flashes went away for a while and have coe back.  Breast symptoms: denies  Last pap smear: s/p hysterectomy  Last mammogram: 2017, BiRads 1  Last colonoscopy: 2019  Shewas diagnosed with endometrial cancer in 09/2014. Her final pathology showed a focus of grade 1 endometrioid adenocarcinoma in a background of EIN in a fragment of polyp during her hysterectomy. The cancer did not invade the myometrium. She has been presenting for every 6 month surveillance since that time. Her last surveillance was one year ago.She is out of the surveillance window now. She has some headaches occasional that she states have been off and on over 20 years.  She does deny cough, weight changes (she continues to intentionally lose weight), bloating, constipation, early satiety, and vaginal bleeding.  Past Medical History:  Diagnosis Date  . Cancer ([place])    endometrial ca newly dx 6.2016  . Endometrial cancer ([place])   . GERD (gastroesophageal reflux disease)   . Migraine   . Sleep apnea     Past Surgical History:  Procedure Laterality Date  . CERVICAL POLYPECTOMY    . INGUINAL HERNIA REPAIR Left   . LAPAROSCOPIC HYSTERECTOMY N/A 09/27/2014   Procedure: HYSTERECTOMY TOTAL LAPAROSCOPIC;  Surgeon: Prentice Docker, MD;  Location: [facility];  Service: Gynecology;  Laterality: N/A;  . LAPAROTOMY N/A 09/27/2014   Procedure: LAPAROTOMY;  Surgeon: Mellody Drown, MD;  Location: [facility];  Service: Gynecology;  Laterality: N/A;  . LYMPH NODE BIOPSY N/A 09/27/2014   Procedure: LYMPH NODE BIOPSY;  Surgeon: Mellody Drown, MD;  Location: [facility];  Service: Gynecology;  Laterality: N/A;  . MYOMECTOMY    .  PELVIC LYMPH NODE DISSECTION N/A 09/27/2014   Procedure: PELVIC LYMPH NODE DISSECTION;  Surgeon: Mellody Drown, MD;  Location: [facility];  Service: Gynecology;  Laterality: N/A;    Prior to Admission medications   Denies    Allergies  Allergen Reactions  . Codeine Other (See Comments)    Vasovagal response- hypotensive  . Penicillins   . Sulfa Antibiotics     Obstetric History: [id number]  Social History   Socioeconomic History  . Marital status: Married    Spouse name: Not on file  . Number of children: Not on file  . Years of education: Not on file  . Highest education level: Not on file  Occupational History  . Not on file  Tobacco Use  . Smoking status: Never Smoker  . Smokeless tobacco: Never Used  . Tobacco comment: 2 cigarettes a week, during college. Smoke for 4 years during college  Vaping Use  . Vaping Use: Never used  Substance and Sexual Activity  . Alcohol use: Yes    Alcohol/week: 1.0 standard drink    Types: 1 Glasses of wine per week    Comment: glass a day  . Drug use: No  . Sexual activity: Yes    Birth control/protection: Surgical    Comment: Hysterectomy  Other Topics Concern  . Not on file  Social History Narrative   ** Merged History Encounter **       Social Determinants of Health   Financial Resource Strain: Not on file  Food Insecurity: Not on file  Transportation Needs: Not on file  Physical Activity: Not on file  Stress: Not on file  Social Connections: Not on file  Intimate Partner Violence: Not on file    Family History  Problem Relation Age of Onset  . Deep vein thrombosis Mother   . Heart attack Mother   . Diabetes Mother   . Diabetes Brother   . Diabetes Sister   . Diabetes Maternal Aunt   . Colon cancer Father     Review of Systems  Constitutional: Negative.   HENT: Negative.   Eyes: Negative.   Respiratory: Negative.   Cardiovascular: Negative.   Gastrointestinal: Negative.   Genitourinary: Negative.    Musculoskeletal: Negative.   Skin: Negative.   Neurological: Negative.   Psychiatric/Behavioral: Negative.      Physical Exam Vitals: BP (!) 144/83   Ht 5\' 7"  (1.702 m)   Wt 168 lb (76.2 kg)   BMI 26.31 kg/m   Physical Exam Constitutional:      General: She is not in acute distress.    Appearance: Normal appearance. She is well-developed.  Genitourinary:     Vulva and bladder normal.     Right Labia: No rash, tenderness, lesions, skin changes or Bartholin's cyst.    Left Labia: No tenderness, lesions, skin changes, Bartholin's cyst or rash.    No inguinal adenopathy present in the right or left side.    Pelvic Tanner Score: 5/5.    Vaginal cuff intact.    No vaginal discharge, erythema, tenderness or bleeding.      Right Adnexa: not tender, not full and no mass present.    Left Adnexa: not tender, not full and no mass present.    Cervix is absent.     Uterus is absent.     Pelvic exam was performed with patient in the lithotomy position.  Breasts:     Right: No inverted nipple, mass, nipple discharge, skin change or tenderness.     Left: No inverted nipple, mass, nipple discharge, skin change or tenderness.    HENT:     Head: Normocephalic and atraumatic.  Eyes:     General: No scleral icterus.    Conjunctiva/sclera: Conjunctivae normal.  Neck:     Thyroid: No thyromegaly.  Cardiovascular:     Rate and Rhythm: Normal rate and regular rhythm.     Heart sounds: No murmur heard. No friction rub. No gallop.   Pulmonary:     Effort: Pulmonary effort is normal. No respiratory distress.     Breath sounds: Normal breath sounds. No wheezing or rales.  Abdominal:     General: Bowel sounds are normal. There is no distension.     Palpations: Abdomen is soft. There is no mass.     Tenderness: There is no abdominal tenderness. There is no guarding or rebound.     Hernia: There is no hernia in the left inguinal area or right inguinal area.  Musculoskeletal:        General:  No swelling or tenderness. Normal range of motion.     Cervical back: Normal range of motion and neck supple.  Lymphadenopathy:     Cervical: No cervical adenopathy.     Lower Body: No right inguinal adenopathy. No left inguinal adenopathy.  Neurological:     General: No focal deficit present.     Mental Status: She is alert and oriented to person, place, and time.     Cranial Nerves: No cranial nerve deficit.  Skin:  General: Skin is warm and dry.     Findings: No erythema or rash.  Psychiatric:        Mood and Affect: Mood normal.        Behavior: Behavior normal.        Judgment: Judgment normal.      Female chaperone present for pelvic and breast  portions of the physical exam  Results: AUDIT Questionnaire (screen for alcoholism): 3 PHQ-9: 0   Assessment and Plan:  60 y.o. G14P2000 female here for routine annual gynecologic examination  Plan: Problem List Items Addressed This Visit   None   Visit Diagnoses    Women's annual routine gynecological examination    -  Primary   Relevant Orders   Hgb A1c w/o eAG   TSH   Lipid Panel With LDL/HDL Ratio   Comprehensive metabolic panel   CBC with Differential/Platelet   Screening for depression       Screening for alcoholism       Screening for diabetes mellitus       Relevant Orders   Hgb A1c w/o eAG   Screening for thyroid disorder       Relevant Orders   TSH   Screening cholesterol level       Relevant Orders   Lipid Panel With LDL/HDL Ratio   Laboratory examination ordered as part of a routine general medical examination       Relevant Orders   Hgb A1c w/o eAG   TSH   Lipid Panel With LDL/HDL Ratio   Comprehensive metabolic panel   CBC with Differential/Platelet      Screening: -- Blood pressure screen normal -- Colonoscopy - not due -- Mammogram - due. Patient to call [facility] to arrange. She understands that it is her responsibility to arrange this. -- Weight screening: normal -- Depression  screening negative (PHQ-9) -- Nutrition: normal -- cholesterol screening: will obtain -- osteoporosis screening: not due -- tobacco screening: not using -- alcohol screening: AUDIT questionnaire indicates low-risk usage. -- family history of breast cancer screening: done. not at high risk. -- no evidence of domestic violence or intimate partner violence. -- STD screening: gonorrhea/chlamydia NAAT not collected per patient request. -- pap smear not collected per ASCCP guidelines   Prentice Docker, MD 08/22/2020 2:05 PM

## 2020-08-22 NOTE — Patient Instructions (Addendum)
For mammogram at [place] at [facility] [address] [place],  [place]  [address] Get Driving Directions Main: [phone]   High Cholesterol  High cholesterol is a condition in which the blood has high levels of a white, waxy substance similar to fat (cholesterol). The liver makes all the cholesterol that the body needs. The human body needs small amounts of cholesterol to help build cells. A person gets extra or excess cholesterol from the food that he or she eats. The blood carries cholesterol from the liver to the rest of the body. If you have high cholesterol, deposits (plaques) may build up on the walls of your arteries. Arteries are the blood vessels that carry blood away from your heart. These plaques make the arteries narrow and stiff. Cholesterol plaques increase your risk for heart attack and stroke. Work with your health care provider to keep your cholesterol levels in a healthy range. What increases the risk? The following factors may make you more likely to develop this condition:  Eating foods that are high in animal fat (saturated fat) or cholesterol.  Being overweight.  Not getting enough exercise.  A family history of high cholesterol (familial hypercholesterolemia).  Use of tobacco products.  Having diabetes. What are the signs or symptoms? There are no symptoms of this condition. How is this diagnosed? This condition may be diagnosed based on the results of a blood test.  If you are older than 60 years of age, your health care provider may check your cholesterol levels every 4-6 years.  You may be checked more often if you have high cholesterol or other risk factors for heart disease. The blood test for cholesterol measures:  "Bad" cholesterol, or LDL cholesterol. This is the main type of cholesterol that causes heart disease. The desired level is less than 100 mg/dL.  "Good" cholesterol, or HDL cholesterol. HDL helps  protect against heart disease by cleaning the arteries and carrying the LDL to the liver for processing. The desired level for HDL is 60 mg/dL or higher.  Triglycerides. These are fats that your body can store or burn for energy. The desired level is less than 150 mg/dL.  Total cholesterol. This measures the total amount of cholesterol in your blood and includes LDL, HDL, and triglycerides. The desired level is less than 200 mg/dL. How is this treated? This condition may be treated with:  Diet changes. You may be asked to eat foods that have more fiber and less saturated fats or added sugar.  Lifestyle changes. These may include regular exercise, maintaining a healthy weight, and quitting use of tobacco products.  Medicines. These are given when diet and lifestyle changes have not worked. You may be prescribed a statin medicine to help lower your cholesterol levels. Follow these instructions at home: Eating and drinking  Eat a healthy, balanced diet. This diet includes: ? Daily servings of a variety of fresh, frozen, or canned fruits and vegetables. ? Daily servings of whole grain foods that are rich in fiber. ? Foods that are low in saturated fats and trans fats. These include poultry and fish without skin, lean cuts of meat, and low-fat dairy products. ? A variety of fish, especially oily fish that contain omega-3 fatty acids. Aim to eat fish at least 2 times a week.  Avoid foods and drinks that have added sugar.  Use healthy cooking methods, such as roasting, grilling, broiling, baking, poaching, steaming, and stir-frying. Do not fry your food  except for stir-frying.   Lifestyle  Get regular exercise. Aim to exercise for a total of 150 minutes a week. Increase your activity level by doing activities such as gardening, walking, and taking the stairs.  Do not use any products that contain nicotine or tobacco, such as cigarettes, e-cigarettes, and chewing tobacco. If you need help  quitting, ask your health care provider.   General instructions  Take over-the-counter and prescription medicines only as told by your health care provider.  Keep all follow-up visits as told by your health care provider. This is important. Where to find more information  American Heart Association: www.heart.org  National Heart, Lung, and Blood Institute: https://wilson-eaton.com/ Contact a health care provider if:  You have trouble achieving or maintaining a healthy diet or weight.  You are starting an exercise program.  You are unable to stop smoking. Get help right away if:  You have chest pain.  You have trouble breathing.  You have any symptoms of a stroke. "BE FAST" is an easy way to remember the main warning signs of a stroke: ? B - Balance. Signs are dizziness, sudden trouble walking, or loss of balance. ? E - Eyes. Signs are trouble seeing or a sudden change in vision. ? F - Face. Signs are sudden weakness or numbness of the face, or the face or eyelid drooping on one side. ? A - Arms. Signs are weakness or numbness in an arm. This happens suddenly and usually on one side of the body. ? S - Speech. Signs are sudden trouble speaking, slurred speech, or trouble understanding what people say. ? T - Time. Time to call emergency services. Write down what time symptoms started.  You have other signs of a stroke, such as: ? A sudden, severe headache with no known cause. ? Nausea or vomiting. ? Seizure. These symptoms may represent a serious problem that is an emergency. Do not wait to see if the symptoms will go away. Get medical help right away. Call your local emergency services (911 in the [place] Do not drive yourself to the hospital. Summary  Cholesterol plaques increase your risk for heart attack and stroke. Work with your health care provider to keep your cholesterol levels in a healthy range.  Eat a healthy, balanced diet, get regular exercise, and maintain a healthy  weight.  Do not use any products that contain nicotine or tobacco, such as cigarettes, e-cigarettes, and chewing tobacco.  Get help right away if you have any symptoms of a stroke. This information is not intended to replace advice given to you by your health care provider. Make sure you discuss any questions you have with your health care provider. Document Revised: 02/21/2019 Document Reviewed: 02/21/2019 Elsevier Patient Education  2021 [employer].

## 2020-08-23 LAB — CBC WITH DIFFERENTIAL/PLATELET
Basophils Absolute: 0.1 10*3/uL (ref 0.0–0.2)
Basos: 1 %
EOS (ABSOLUTE): 0.1 10*3/uL (ref 0.0–0.4)
Eos: 1 %
Hematocrit: 43.7 % (ref 34.0–46.6)
Hemoglobin: 14.9 g/dL (ref 11.1–15.9)
Immature Grans (Abs): 0 10*3/uL (ref 0.0–0.1)
Immature Granulocytes: 0 %
Lymphocytes Absolute: 2 10*3/uL (ref 0.7–3.1)
Lymphs: 26 %
MCH: 30.1 pg (ref 26.6–33.0)
MCHC: 34.1 g/dL (ref 31.5–35.7)
MCV: 88 fL (ref 79–97)
Monocytes Absolute: 0.6 10*3/uL (ref 0.1–0.9)
Monocytes: 7 %
Neutrophils Absolute: 4.9 10*3/uL (ref 1.4–7.0)
Neutrophils: 65 %
Platelets: 349 10*3/uL (ref 150–450)
RBC: 4.95 x10E6/uL (ref 3.77–5.28)
RDW: 12.7 % (ref 11.7–15.4)
WBC: 7.6 10*3/uL (ref 3.4–10.8)

## 2020-08-23 LAB — COMPREHENSIVE METABOLIC PANEL
ALT: 12 IU/L (ref 0–32)
AST: 15 IU/L (ref 0–40)
Albumin/Globulin Ratio: 1.9 (ref 1.2–2.2)
Albumin: 4.6 g/dL (ref 3.8–4.9)
Alkaline Phosphatase: 48 IU/L (ref 44–121)
BUN/Creatinine Ratio: 27 (ref 12–28)
BUN: 19 mg/dL (ref 8–27)
Bilirubin Total: 0.4 mg/dL (ref 0.0–1.2)
CO2: 23 mmol/L (ref 20–29)
Calcium: 10.1 mg/dL (ref 8.7–10.3)
Chloride: 96 mmol/L (ref 96–106)
Creatinine, Ser: 0.71 mg/dL (ref 0.57–1.00)
Globulin, Total: 2.4 g/dL (ref 1.5–4.5)
Glucose: 106 mg/dL — ABNORMAL HIGH (ref 65–99)
Potassium: 4.2 mmol/L (ref 3.5–5.2)
Sodium: 134 mmol/L (ref 134–144)
Total Protein: 7 g/dL (ref 6.0–8.5)
eGFR: 97 mL/min/{1.73_m2} (ref >59)

## 2020-08-23 LAB — LIPID PANEL WITH LDL/HDL RATIO
Cholesterol, Total: 244 mg/dL — ABNORMAL HIGH (ref 100–199)
HDL: 70 mg/dL (ref >39)
LDL Chol Calc (NIH): 145 mg/dL — ABNORMAL HIGH (ref 0–99)
LDL/HDL Ratio: 2.1 ratio (ref 0.0–3.2)
Triglycerides: 163 mg/dL — ABNORMAL HIGH (ref 0–149)
VLDL Cholesterol Cal: 29 mg/dL (ref 5–40)

## 2020-08-23 LAB — TSH: TSH: 1.53 u[IU]/mL (ref 0.450–4.500)

## 2020-08-23 LAB — HGB A1C W/O EAG: Hgb A1c MFr Bld: 5.3 % (ref 4.8–5.6)

## 2020-09-04 ENCOUNTER — Ambulatory Visit
Admission: RE | Admit: 2020-09-04 | Discharge: 2020-09-04 | Disposition: A | Discharge status: Home / Self Care | Payer: Federal, State, Local not specified - PPO | Source: Ambulatory Visit | Attending: Obstetrics and Gynecology | Admitting: Obstetrics and Gynecology

## 2020-09-04 ENCOUNTER — Other Ambulatory Visit: Payer: Self-pay

## 2020-09-04 DIAGNOSIS — R0981 Nasal congestion: Secondary | ICD-10-CM | POA: Diagnosis not present

## 2020-09-04 DIAGNOSIS — Z1231 Encounter for screening mammogram for malignant neoplasm of breast: Secondary | ICD-10-CM | POA: Diagnosis not present

## 2020-09-04 DIAGNOSIS — Z20828 Contact with and (suspected) exposure to other viral communicable diseases: Secondary | ICD-10-CM | POA: Diagnosis not present

## 2021-01-07 DIAGNOSIS — L821 Other seborrheic keratosis: Secondary | ICD-10-CM | POA: Diagnosis not present

## 2021-01-07 DIAGNOSIS — D2372 Other benign neoplasm of skin of left lower limb, including hip: Secondary | ICD-10-CM | POA: Diagnosis not present

## 2021-01-07 DIAGNOSIS — L578 Other skin changes due to chronic exposure to nonionizing radiation: Secondary | ICD-10-CM | POA: Diagnosis not present

## 2021-01-07 DIAGNOSIS — L570 Actinic keratosis: Secondary | ICD-10-CM | POA: Diagnosis not present

## 2021-05-09 ENCOUNTER — Ambulatory Visit: Payer: Federal, State, Local not specified - PPO | Admitting: Family Medicine

## 2021-05-09 ENCOUNTER — Encounter: Payer: Self-pay | Admitting: Family Medicine

## 2021-05-09 ENCOUNTER — Other Ambulatory Visit: Payer: Self-pay

## 2021-05-09 VITALS — BP 130/80 | HR 76 | Ht 67.0 in | Wt 180.0 lb

## 2021-05-09 DIAGNOSIS — N3090 Cystitis, unspecified without hematuria: Secondary | ICD-10-CM | POA: Diagnosis not present

## 2021-05-09 LAB — POCT URINALYSIS DIPSTICK
Bilirubin, UA: NEGATIVE
Glucose, UA: NEGATIVE
Ketones, UA: NEGATIVE
Nitrite, UA: NEGATIVE
Protein, UA: NEGATIVE
Spec Grav, UA: 1.01 (ref 1.010–1.025)
Urobilinogen, UA: 0.2 E.U./dL
pH, UA: 7 (ref 5.0–8.0)

## 2021-05-09 MED ORDER — DOXYCYCLINE HYCLATE 100 MG PO TABS: 100.0000 mg | ORAL_TABLET | Freq: Two times a day (BID) | ORAL | 0 refills | Status: DC

## 2021-05-09 NOTE — Progress Notes (Signed)
Date:  05/09/2021   Name:  Grace Sanchez   DOB:  [birth year]   MRN:  [id number]   Chief Complaint: Urinary Tract Infection (Last 5-6 days, burning when urinate- end of stream, cloudy urine)  Urinary Tract Infection  This is a new problem. The current episode started in the past 7 days. The problem occurs intermittently. The problem has been gradually worsening. The quality of the pain is described as burning. The pain is mild. Associated symptoms include frequency. Pertinent negatives include no chills, discharge, flank pain, hematuria, nausea or urgency. Treatments tried: the usual. The treatment provided mild (but no resolution) relief.   Lab Results  Component Value Date   NA 134 08/22/2020   K 4.2 08/22/2020   CO2 23 08/22/2020   GLUCOSE 106 (H) 08/22/2020   BUN 19 08/22/2020   CREATININE 0.71 08/22/2020   CALCIUM 10.1 08/22/2020   EGFR 97 08/22/2020   GFRNONAA 87 01/12/2017   Lab Results  Component Value Date   CHOL 244 (H) 08/22/2020   HDL 70 08/22/2020   LDLCALC 145 (H) 08/22/2020   TRIG 163 (H) 08/22/2020   Lab Results  Component Value Date   TSH 1.530 08/22/2020   Lab Results  Component Value Date   HGBA1C 5.3 08/22/2020   Lab Results  Component Value Date   WBC 7.6 08/22/2020   HGB 14.9 08/22/2020   HCT 43.7 08/22/2020   MCV 88 08/22/2020   PLT 349 08/22/2020   Lab Results  Component Value Date   ALT 12 08/22/2020   AST 15 08/22/2020   ALKPHOS 48 08/22/2020   BILITOT 0.4 08/22/2020   Lab Results  Component Value Date   VD25OH 26.9 (L) 01/12/2017     Review of Systems  Constitutional:  Negative for chills and fever.  HENT:  Negative for drooling, ear discharge, ear pain and sore throat.   Respiratory:  Negative for cough, shortness of breath and wheezing.   Cardiovascular:  Negative for chest pain, palpitations and leg swelling.  Gastrointestinal:  Negative for abdominal pain, blood in stool, constipation, diarrhea and nausea.  Endocrine:  Negative for polydipsia.  Genitourinary:  Positive for frequency. Negative for dysuria, flank pain, hematuria and urgency.  Musculoskeletal:  Negative for back pain, myalgias and neck pain.  Skin:  Negative for rash.  Allergic/Immunologic: Negative for environmental allergies.  Neurological:  Positive for dizziness. Negative for headaches.  Psychiatric/Behavioral:  Negative for suicidal ideas. The patient is not nervous/anxious.    Patient Active Problem List   Diagnosis Date Noted   Endometrial cancer ([place]) 07/11/2016   Status post hysterectomy 09/27/2014   Endometrial intraepithelial neoplasia (EIN) 09/06/2014    Allergies  Allergen Reactions   Codeine Other (See Comments)    Vasovagal response- hypotensive   Penicillins    Sulfa Antibiotics     Past Surgical History:  Procedure Laterality Date   ABDOMINAL HYSTERECTOMY     CERVICAL POLYPECTOMY     INGUINAL HERNIA REPAIR Left    LAPAROSCOPIC HYSTERECTOMY N/A 09/27/2014   Procedure: HYSTERECTOMY TOTAL LAPAROSCOPIC;  Surgeon: Prentice Docker, MD;  Location: [facility];  Service: Gynecology;  Laterality: N/A;   LAPAROTOMY N/A 09/27/2014   Procedure: LAPAROTOMY;  Surgeon: Mellody Drown, MD;  Location: [facility];  Service: Gynecology;  Laterality: N/A;   LYMPH NODE BIOPSY N/A 09/27/2014   Procedure: LYMPH NODE BIOPSY;  Surgeon: Mellody Drown, MD;  Location: [facility];  Service: Gynecology;  Laterality: N/A;   MYOMECTOMY  PELVIC LYMPH NODE DISSECTION N/A 09/27/2014   Procedure: PELVIC LYMPH NODE DISSECTION;  Surgeon: Mellody Drown, MD;  Location: [facility];  Service: Gynecology;  Laterality: N/A;    Social History   Tobacco Use   Smoking status: Never   Smokeless tobacco: Never   Tobacco comments:    2 cigarettes a week, during college. Smoke for 4 years during college  Vaping Use   Vaping Use: Never used  Substance Use Topics   Alcohol use: Yes    Alcohol/week: 1.0 standard drink    Types: 1 Glasses of wine per week     Comment: glass a day   Drug use: No     Medication list has been reviewed and updated.  No outpatient medications have been marked as taking for the 05/09/21 encounter (Office Visit) with Juline Patch, MD.    [facility] 2/9 Scores 05/09/2021 08/22/2020 08/23/2019 04/14/2019  PHQ - 2 Score 0 0 0 0  PHQ- 9 Score 0 0 0 1    GAD 7 : Generalized Anxiety Score 05/09/2021 08/22/2020 08/23/2019  Nervous, Anxious, on Edge 0 0 2  Control/stop worrying 0 0 0  Worry too much - different things 0 0 0  Trouble relaxing 0 0 0  Restless 0 0 0  Easily annoyed or irritable 0 0 0  Afraid - awful might happen 0 0 0  Total GAD 7 Score 0 0 2  Anxiety Difficulty Not difficult at all - -    BP Readings from Last 3 Encounters:  05/09/21 130/80  08/22/20 (!) 144/83  08/23/19 124/72    Physical Exam HENT:     Right Ear: Tympanic membrane normal. There is no impacted cerumen.     Left Ear: Tympanic membrane normal. There is no impacted cerumen.     Nose: Nose normal.  Cardiovascular:     Rate and Rhythm: Normal rate.     Heart sounds: No murmur heard.   No gallop.  Pulmonary:     Breath sounds: No wheezing or rhonchi.  Abdominal:     Tenderness: There is no abdominal tenderness. There is no right CVA tenderness or left CVA tenderness.  Neurological:     Mental Status: She is alert.    Wt Readings from Last 3 Encounters:  05/09/21 180 lb (81.6 kg)  08/22/20 168 lb (76.2 kg)  08/23/19 173 lb (78.5 kg)    BP 130/80    Pulse 76    Ht 5' 7"  (1.702 m)    Wt 180 lb (81.6 kg)    BMI 28.19 kg/m   Assessment and Plan:  1. Cystiti New onset.  Persistent.  Stable.  Remains symptomatic despite over-the-counter preparations.  Urinalysis is indicative of UTI.  We will treat with doxycycline 100 mg twice a day for 5 days since patient is allergic to penicillin with cross-reactivity to cephalosporins, sulfa, and we prefer to avoid quinolones, and insurance will not take Macrodantin. - POCT urinalysis dipstick -  doxycycline (VIBRA-TABS) 100 MG tablet; Take 1 tablet (100 mg total) by mouth 2 (two) times daily.  Dispense: 10 tablet; Refill: 0

## 2021-05-20 ENCOUNTER — Ambulatory Visit: Payer: Self-pay | Admitting: *Deleted

## 2021-05-20 NOTE — Telephone Encounter (Signed)
Summary: uti back after one week   Pt seen 1 week ago for UTI. Put on abx.  Now she has burning w/ urination again.  Urine cloudy, smells and discomfort. Does the dr want to change abx?      Reason for Disposition  [1] SEVERE pain (e.g., excruciating) AND [2] no improvement 2 hours after pain medications  Answer Assessment - Initial Assessment Questions 1. ANTIBIOTIC: "What antibiotic are you taking?" "How many times per day?"     Doxycycline - bid for 5 days- finished 2. DURATION: "When was the antibiotic started?"     05/09/21 3. MAIN SYMPTOM: "What is the main symptom you are concerned about?"     Burning with urination, cloudy,odor 4. FEVER: "Do you have a fever?" If Yes, ask: "What is it, how was it measured, and when did it start?"     no 5. OTHER SYMPTOMS: "Do you have any other symptoms?" (e.g., flank pain, vaginal discharge, blood in urine)     no  Protocols used: Urinary Tract Infection on Antibiotic Follow-up Call - [facility]

## 2021-05-20 NOTE — Telephone Encounter (Signed)
Pt. Calling back to report "I haven't heard back from anyone yet." Please advise pt.

## 2021-05-20 NOTE — Telephone Encounter (Signed)
°  Chief Complaint: UTI back after treatment Symptoms: pain, odor, cloudy Frequency: treated 05/09/21- symptoms restarted Pertinent Negatives: Patient denies fever Disposition: [] ED /[] Urgent Care (no appt availability in office) / [] Appointment(In office/virtual)/ []  [facility]/ [] Home Care/ [] Refused Recommended Disposition /[] [facility]/ []  Follow-up with PCP Additional Notes: Patient wants to know if she can have different treatment- may be willing to pay out of pocket for antibiotic if PCP feels needed.

## 2021-05-21 ENCOUNTER — Ambulatory Visit: Payer: Federal, State, Local not specified - PPO | Admitting: Family Medicine

## 2021-05-21 ENCOUNTER — Other Ambulatory Visit: Payer: Self-pay

## 2021-05-21 VITALS — BP 128/78 | HR 64 | Ht 67.0 in | Wt 181.0 lb

## 2021-05-21 DIAGNOSIS — N3090 Cystitis, unspecified without hematuria: Secondary | ICD-10-CM

## 2021-05-21 DIAGNOSIS — R300 Dysuria: Secondary | ICD-10-CM | POA: Diagnosis not present

## 2021-05-21 LAB — POCT URINALYSIS DIPSTICK
Bilirubin, UA: NEGATIVE
Blood, UA: NEGATIVE
Glucose, UA: NEGATIVE
Ketones, UA: NEGATIVE
Nitrite, UA: NEGATIVE
Protein, UA: POSITIVE — AB
Spec Grav, UA: 1.015 (ref 1.010–1.025)
Urobilinogen, UA: 0.2 E.U./dL
pH, UA: 6.5 (ref 5.0–8.0)

## 2021-05-21 MED ORDER — CIPROFLOXACIN HCL 250 MG PO TABS: 250.0000 mg | ORAL_TABLET | Freq: Two times a day (BID) | ORAL | 0 refills | Status: AC

## 2021-05-21 MED ORDER — FLUCONAZOLE 150 MG PO TABS: 150.0000 mg | ORAL_TABLET | Freq: Once | ORAL | 1 refills | Status: AC

## 2021-05-21 NOTE — Progress Notes (Addendum)
Date:  05/21/2021   Name:  Grace Sanchez   DOB:  [birth year]   MRN:  [id number]   Chief Complaint: Urinary Tract Infection (Feels like symptoms came back. Having - finished 5day doxy on 2/7)  Urinary Tract Infection  This is a recurrent problem. The current episode started in the past 7 days. The problem occurs intermittently. The problem has been waxing and waning. The quality of the pain is described as burning. The pain is mild (suprapubic). There has been no fever. Associated symptoms include urgency. Pertinent negatives include no chills, frequency, hematuria, hesitancy or nausea. The treatment provided mild relief.   Lab Results  Component Value Date   NA 134 08/22/2020   K 4.2 08/22/2020   CO2 23 08/22/2020   GLUCOSE 106 (H) 08/22/2020   BUN 19 08/22/2020   CREATININE 0.71 08/22/2020   CALCIUM 10.1 08/22/2020   EGFR 97 08/22/2020   GFRNONAA 87 01/12/2017   Lab Results  Component Value Date   CHOL 244 (H) 08/22/2020   HDL 70 08/22/2020   LDLCALC 145 (H) 08/22/2020   TRIG 163 (H) 08/22/2020   Lab Results  Component Value Date   TSH 1.530 08/22/2020   Lab Results  Component Value Date   HGBA1C 5.3 08/22/2020   Lab Results  Component Value Date   WBC 7.6 08/22/2020   HGB 14.9 08/22/2020   HCT 43.7 08/22/2020   MCV 88 08/22/2020   PLT 349 08/22/2020   Lab Results  Component Value Date   ALT 12 08/22/2020   AST 15 08/22/2020   ALKPHOS 48 08/22/2020   BILITOT 0.4 08/22/2020   Lab Results  Component Value Date   VD25OH 26.9 (L) 01/12/2017     Review of Systems  Constitutional:  Negative for chills and fever.  HENT:  Negative for drooling, ear discharge, ear pain and sore throat.   Respiratory:  Negative for cough, shortness of breath and wheezing.   Cardiovascular:  Negative for chest pain, palpitations and leg swelling.  Gastrointestinal:  Negative for abdominal pain, blood in stool, constipation, diarrhea and nausea.  Endocrine: Negative for  polydipsia.  Genitourinary:  Positive for urgency. Negative for dysuria, frequency, hematuria and hesitancy.  Musculoskeletal:  Negative for back pain, myalgias and neck pain.  Skin:  Negative for rash.  Allergic/Immunologic: Negative for environmental allergies.  Neurological:  Negative for dizziness and headaches.  Hematological:  Does not bruise/bleed easily.  Psychiatric/Behavioral:  Negative for suicidal ideas. The patient is not nervous/anxious.    Patient Active Problem List   Diagnosis Date Noted   Endometrial cancer ([place]) 07/11/2016   Status post hysterectomy 09/27/2014   Endometrial intraepithelial neoplasia (EIN) 09/06/2014    Allergies  Allergen Reactions   Codeine Other (See Comments)    Vasovagal response- hypotensive   Penicillins    Sulfa Antibiotics     Past Surgical History:  Procedure Laterality Date   ABDOMINAL HYSTERECTOMY     CERVICAL POLYPECTOMY     INGUINAL HERNIA REPAIR Left    LAPAROSCOPIC HYSTERECTOMY N/A 09/27/2014   Procedure: HYSTERECTOMY TOTAL LAPAROSCOPIC;  Surgeon: Prentice Docker, MD;  Location: [facility];  Service: Gynecology;  Laterality: N/A;   LAPAROTOMY N/A 09/27/2014   Procedure: LAPAROTOMY;  Surgeon: Mellody Drown, MD;  Location: [facility];  Service: Gynecology;  Laterality: N/A;   LYMPH NODE BIOPSY N/A 09/27/2014   Procedure: LYMPH NODE BIOPSY;  Surgeon: Mellody Drown, MD;  Location: [facility];  Service: Gynecology;  Laterality: N/A;   MYOMECTOMY  PELVIC LYMPH NODE DISSECTION N/A 09/27/2014   Procedure: PELVIC LYMPH NODE DISSECTION;  Surgeon: Mellody Drown, MD;  Location: [facility];  Service: Gynecology;  Laterality: N/A;    Social History   Tobacco Use   Smoking status: Never   Smokeless tobacco: Never   Tobacco comments:    2 cigarettes a week, during college. Smoke for 4 years during college  Vaping Use   Vaping Use: Never used  Substance Use Topics   Alcohol use: Yes    Alcohol/week: 1.0 standard drink    Types: 1  Glasses of wine per week    Comment: glass a day   Drug use: No     Medication list has been reviewed and updated.  No outpatient medications have been marked as taking for the 05/21/21 encounter (Office Visit) with Juline Patch, MD.    [facility] 2/9 Scores 05/09/2021 08/22/2020 08/23/2019 04/14/2019  PHQ - 2 Score 0 0 0 0  PHQ- 9 Score 0 0 0 1    GAD 7 : Generalized Anxiety Score 05/09/2021 08/22/2020 08/23/2019  Nervous, Anxious, on Edge 0 0 2  Control/stop worrying 0 0 0  Worry too much - different things 0 0 0  Trouble relaxing 0 0 0  Restless 0 0 0  Easily annoyed or irritable 0 0 0  Afraid - awful might happen 0 0 0  Total GAD 7 Score 0 0 2  Anxiety Difficulty Not difficult at all - -    BP Readings from Last 3 Encounters:  05/21/21 128/78  05/09/21 130/80  08/22/20 (!) 144/83    Physical Exam Vitals and nursing note reviewed.  Constitutional:      Appearance: She is well-developed.  HENT:     Head: Normocephalic.     Right Ear: Tympanic membrane, ear canal and external ear normal.     Left Ear: Tympanic membrane, ear canal and external ear normal.     Nose: Nose normal.  Eyes:     General: Lids are everted, no foreign bodies appreciated. No scleral icterus.       Left eye: No foreign body or hordeolum.     Conjunctiva/sclera: Conjunctivae normal.     Right eye: Right conjunctiva is not injected.     Left eye: Left conjunctiva is not injected.     Pupils: Pupils are equal, round, and reactive to light.  Neck:     Thyroid: No thyromegaly.     Vascular: No JVD.     Trachea: No tracheal deviation.  Cardiovascular:     Rate and Rhythm: Normal rate and regular rhythm.     Heart sounds: Normal heart sounds. No murmur heard.   No friction rub. No gallop.  Pulmonary:     Effort: Pulmonary effort is normal. No respiratory distress.     Breath sounds: Normal breath sounds. No wheezing, rhonchi or rales.  Abdominal:     General: Bowel sounds are normal.     Palpations:  Abdomen is soft. There is no hepatomegaly, splenomegaly or mass.     Tenderness: There is abdominal tenderness in the suprapubic area. There is no guarding or rebound.  Musculoskeletal:        General: Normal range of motion.     Cervical back: Normal range of motion and neck supple.  Lymphadenopathy:     Cervical: No cervical adenopathy.  Skin:    General: Skin is warm.  Neurological:     Mental Status: She is alert.  Psychiatric:  Mood and Affect: Mood is not anxious or depressed.    Wt Readings from Last 3 Encounters:  05/21/21 181 lb (82.1 kg)  05/09/21 180 lb (81.6 kg)  08/22/20 168 lb (76.2 kg)    BP 128/78    Pulse 64    Ht 5' 7"  (1.702 m)    Wt 181 lb (82.1 kg)    BMI 28.35 kg/m   Assessment and Plan:  1. Cystitis New onset.  Persistent.  Stable.  Dipstick urinalysis - POCT urinalysis dipstick with trace white cells consistent with early cystitis.  We will send urine culture.  In the meantime cover with Cipro 250 mg twice a day for 3 days. - Urine Culture  2. Dysuria Patient with history of dysuria may also be secondary to yeast infection and Diflucan will be called in for coverage. - fluconazole (DIFLUCAN) 150 MG tablet; Take 1 tablet (150 mg total) by mouth once for 1 dose.  Dispense: 1 tablet; Refill: 1

## 2021-05-27 LAB — URINE CULTURE

## 2021-05-28 ENCOUNTER — Encounter: Payer: Self-pay | Admitting: Family Medicine

## 2021-07-27 IMAGING — MG MM DIGITAL SCREENING BILAT W/ TOMO AND CAD
8 series · 8 of 24 positions shown · non-contrast
Comparison: Previous exam(s).

CLINICAL DATA: Screening.

EXAM:
DIGITAL SCREENING BILATERAL MAMMOGRAM WITH TOMOSYNTHESIS AND CAD
TECHNIQUE: Bilateral screening digital craniocaudal and mediolateral oblique
mammograms were obtained. Bilateral screening digital breast
tomosynthesis was performed. The images were evaluated with
computer-aided detection.

[R CC synth-2D]
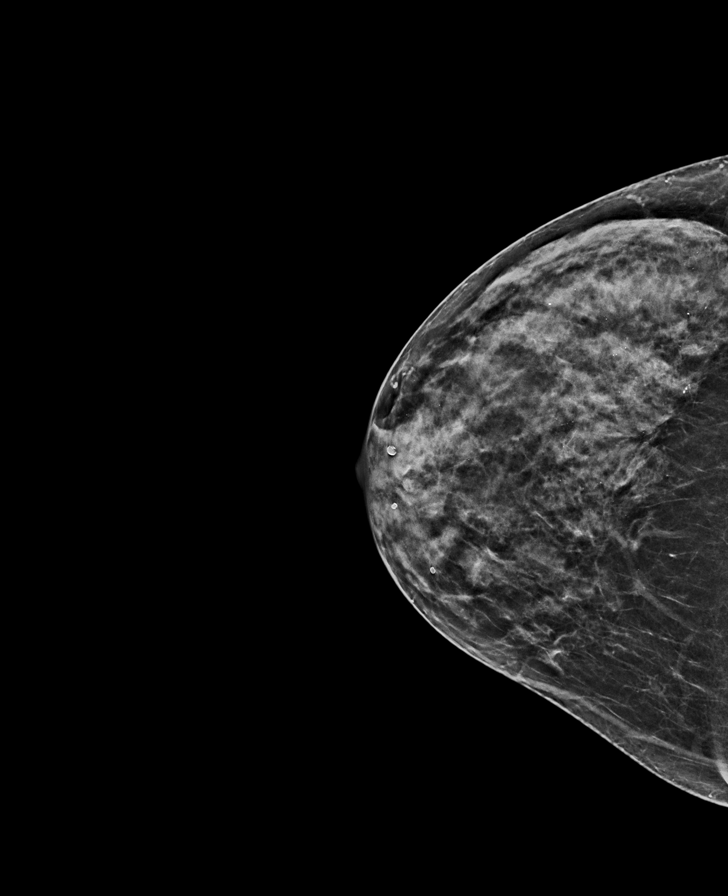

[L CC synth-2D]
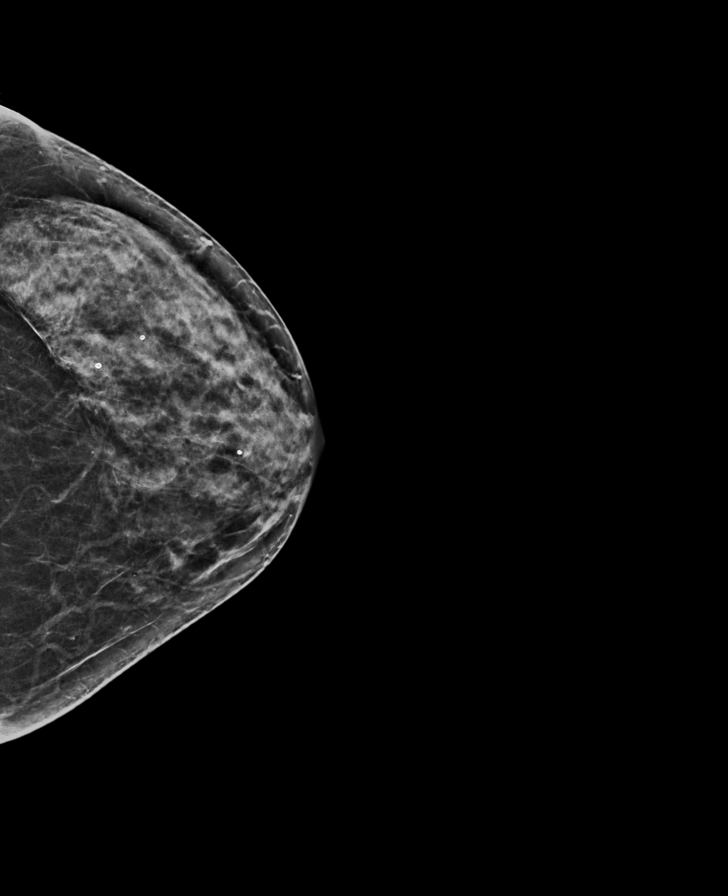

[L MLO synth-2D]
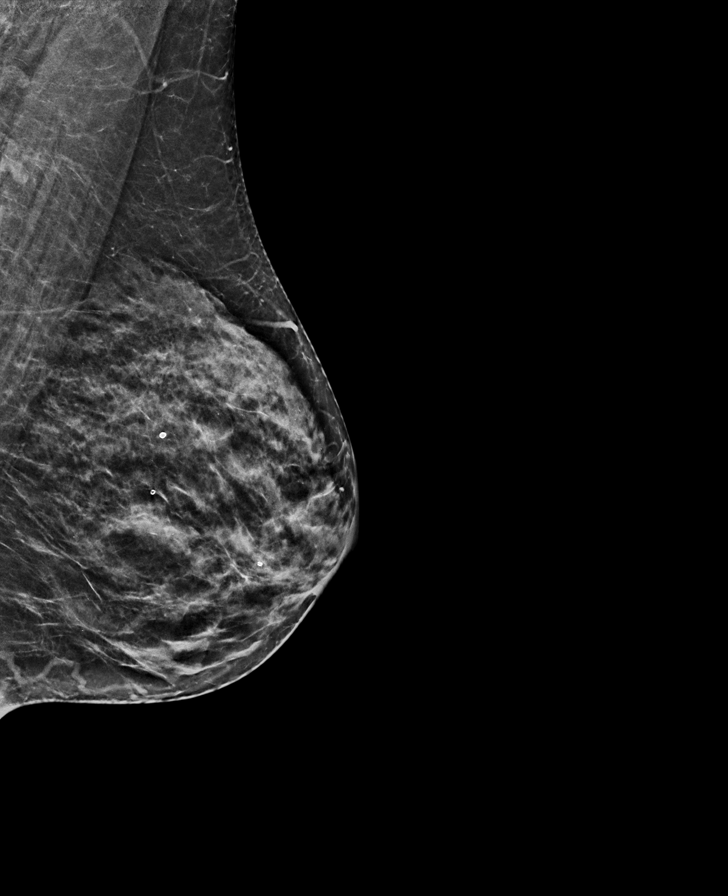

[R MLO synth-2D]
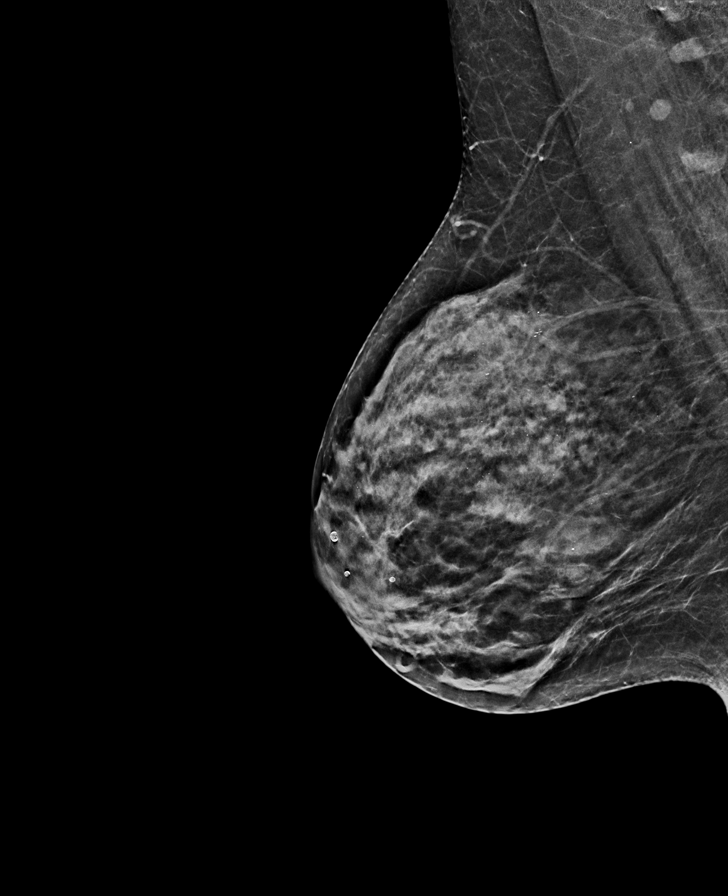

[R MLO tomo · tomo slice 29/57.0]
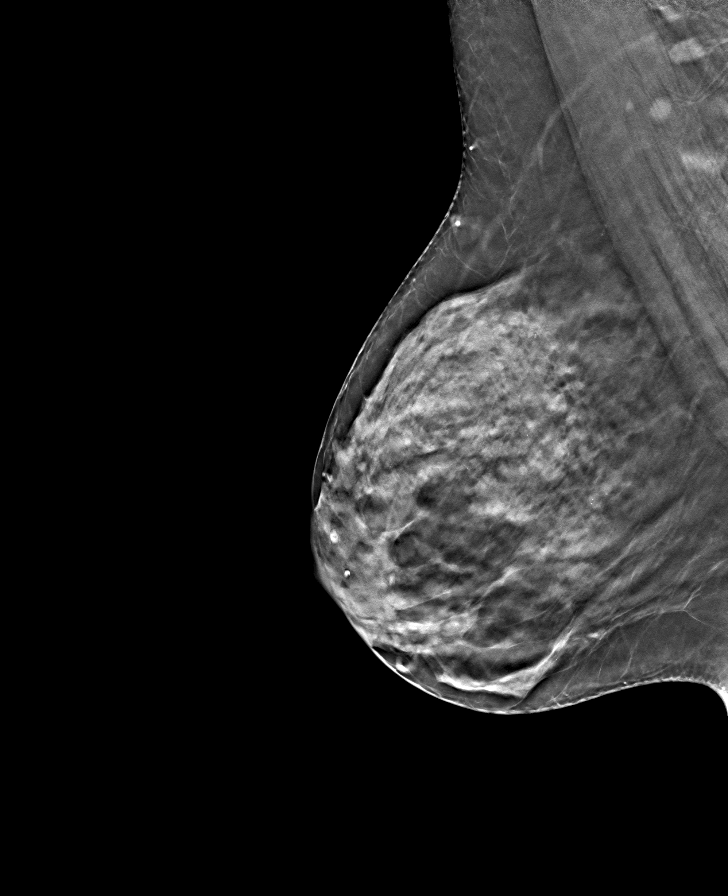

[R CC tomo · tomo slice 28/55.0]
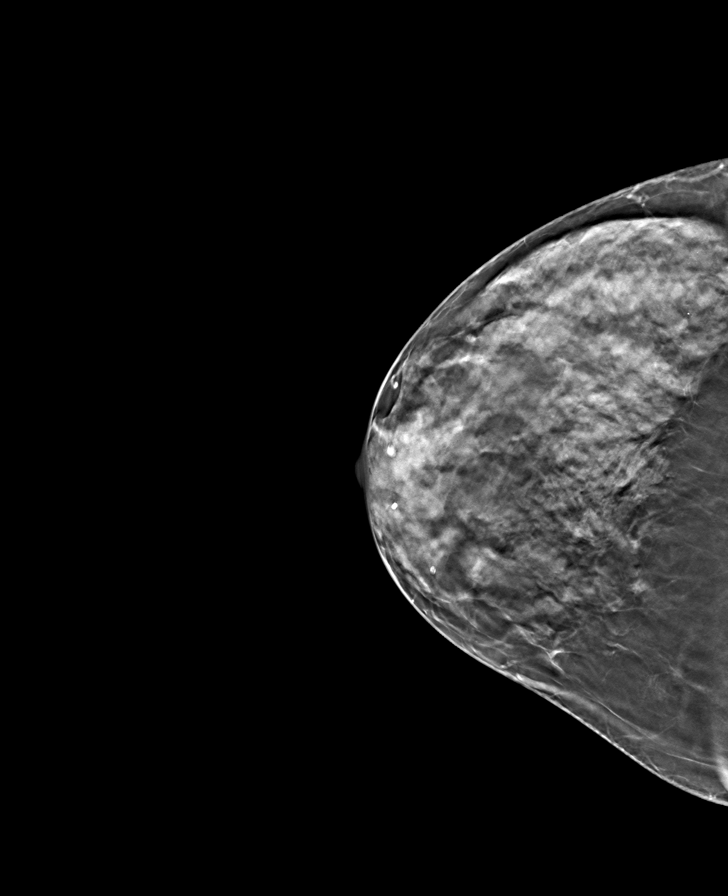

[L CC tomo · tomo slice 30/59.0]
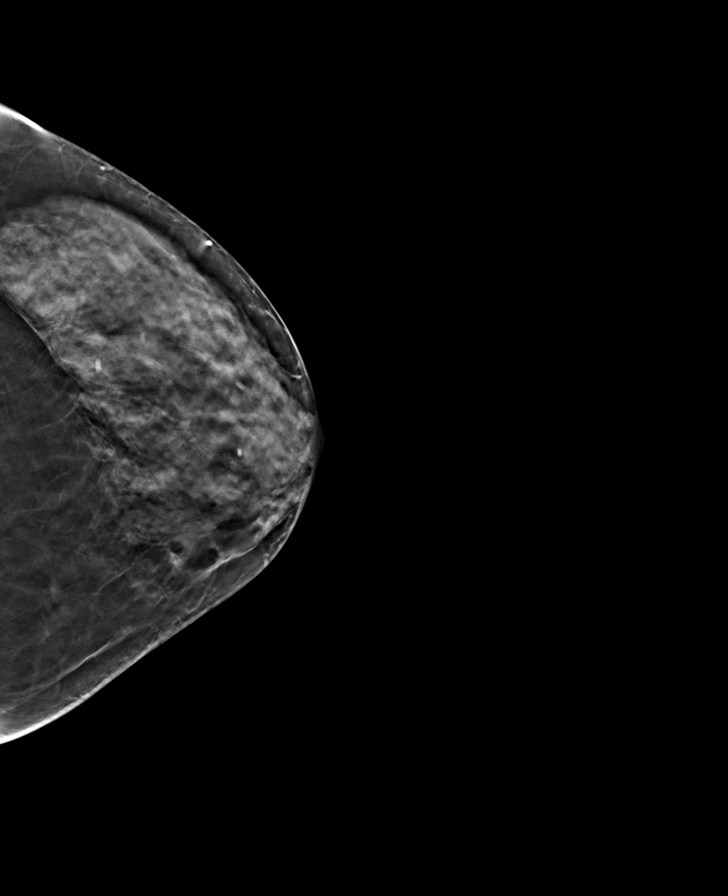

[L MLO tomo · tomo slice 29/58.0]
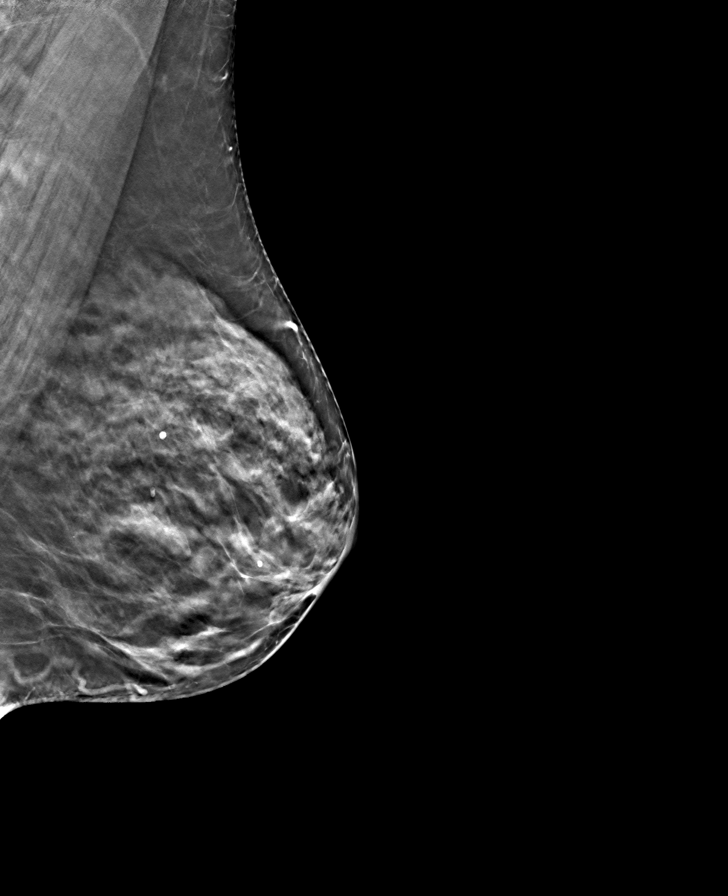

[8 of 24 positions shown; findings below may reference images not displayed]

ACR Breast Density Category d: The breast tissue is extremely dense,
which lowers the sensitivity of mammography
FINDINGS: There are no findings suspicious for malignancy. The images were
evaluated with computer-aided detection.
IMPRESSION: No mammographic evidence of malignancy. A result letter of this
screening mammogram will be mailed directly to the patient.

RECOMMENDATION:
Screening mammogram in one year. (Code:95-0-E9V)

BI-RADS CATEGORY  1: Negative.

## 2021-11-13 ENCOUNTER — Other Ambulatory Visit: Payer: Self-pay | Admitting: Family Medicine

## 2021-11-13 DIAGNOSIS — Z1231 Encounter for screening mammogram for malignant neoplasm of breast: Secondary | ICD-10-CM

## 2021-11-27 ENCOUNTER — Ambulatory Visit
Admission: RE | Admit: 2021-11-27 | Discharge: 2021-11-27 | Disposition: A | Discharge status: Home / Self Care | Payer: Federal, State, Local not specified - PPO | Source: Ambulatory Visit | Attending: Family Medicine | Admitting: Family Medicine

## 2021-11-27 DIAGNOSIS — Z1331 Encounter for screening for depression: Secondary | ICD-10-CM | POA: Diagnosis not present

## 2021-11-27 DIAGNOSIS — Z1231 Encounter for screening mammogram for malignant neoplasm of breast: Secondary | ICD-10-CM | POA: Diagnosis not present

## 2021-11-27 DIAGNOSIS — Z8542 Personal history of malignant neoplasm of other parts of uterus: Secondary | ICD-10-CM | POA: Diagnosis not present

## 2021-11-27 DIAGNOSIS — Z01419 Encounter for gynecological examination (general) (routine) without abnormal findings: Secondary | ICD-10-CM | POA: Diagnosis not present

## 2021-11-27 DIAGNOSIS — Z1339 Encounter for screening examination for other mental health and behavioral disorders: Secondary | ICD-10-CM | POA: Diagnosis not present

## 2021-11-29 ENCOUNTER — Telehealth: Payer: Self-pay | Admitting: Family Medicine

## 2021-11-29 NOTE — Telephone Encounter (Signed)
Copied from [place] [phone]. Topic: General - Call Back - No Documentation >> Nov 29, 2021  3:19 PM Jacinto Reap M wrote: Reason for CRM: Pt stated she was returning call to [place]. Pt requests call back. Cb# [phone]

## 2021-12-25 ENCOUNTER — Encounter: Payer: Self-pay | Admitting: Family Medicine

## 2021-12-25 ENCOUNTER — Ambulatory Visit: Payer: Federal, State, Local not specified - PPO | Admitting: Family Medicine

## 2021-12-25 VITALS — BP 122/78 | HR 68 | Ht 67.0 in | Wt 185.0 lb

## 2021-12-25 DIAGNOSIS — Z6828 Body mass index (BMI) 28.0-28.9, adult: Secondary | ICD-10-CM

## 2021-12-25 DIAGNOSIS — E782 Mixed hyperlipidemia: Secondary | ICD-10-CM

## 2021-12-25 DIAGNOSIS — Z23 Encounter for immunization: Secondary | ICD-10-CM | POA: Diagnosis not present

## 2021-12-25 NOTE — Progress Notes (Signed)
Date:  12/25/2021   Name:  Grace Sanchez   DOB:  [birth year]   MRN:  [id number]   Chief Complaint: Flu Vaccine and Hyperlipidemia (No meds)  Hyperlipidemia This is a chronic problem. The current episode started more than 1 year ago. The problem is controlled. Recent lipid tests were reviewed and are normal. She has no history of chronic renal disease, diabetes, hypothyroidism, liver disease, obesity or nephrotic syndrome. There are no known factors aggravating her hyperlipidemia. Pertinent negatives include no chest pain, focal sensory loss, focal weakness, leg pain, myalgias or shortness of breath. Current antihyperlipidemic treatment includes diet change. The current treatment provides moderate improvement of lipids. There are no compliance problems.     Lab Results  Component Value Date   NA 134 08/22/2020   K 4.2 08/22/2020   CO2 23 08/22/2020   GLUCOSE 106 (H) 08/22/2020   BUN 19 08/22/2020   CREATININE 0.71 08/22/2020   CALCIUM 10.1 08/22/2020   EGFR 97 08/22/2020   GFRNONAA 87 01/12/2017   Lab Results  Component Value Date   CHOL 244 (H) 08/22/2020   HDL 70 08/22/2020   LDLCALC 145 (H) 08/22/2020   TRIG 163 (H) 08/22/2020   Lab Results  Component Value Date   TSH 1.530 08/22/2020   Lab Results  Component Value Date   HGBA1C 5.3 08/22/2020   Lab Results  Component Value Date   WBC 7.6 08/22/2020   HGB 14.9 08/22/2020   HCT 43.7 08/22/2020   MCV 88 08/22/2020   PLT 349 08/22/2020   Lab Results  Component Value Date   ALT 12 08/22/2020   AST 15 08/22/2020   ALKPHOS 48 08/22/2020   BILITOT 0.4 08/22/2020   Lab Results  Component Value Date   VD25OH 26.9 (L) 01/12/2017     Review of Systems  Constitutional: Negative.  Negative for chills, fatigue, fever and unexpected weight change.  HENT:  Negative for congestion, ear discharge, ear pain, rhinorrhea, sinus pressure, sneezing and sore throat.   Respiratory:  Negative for cough, shortness of breath,  wheezing and stridor.   Cardiovascular:  Negative for chest pain.  Gastrointestinal:  Negative for abdominal pain, blood in stool, constipation, diarrhea and nausea.  Genitourinary:  Negative for dysuria, flank pain, frequency, hematuria, urgency and vaginal discharge.  Musculoskeletal:  Negative for arthralgias, back pain and myalgias.  Skin:  Negative for rash.  Neurological:  Negative for dizziness, focal weakness, weakness and headaches.  Hematological:  Negative for adenopathy. Does not bruise/bleed easily.  Psychiatric/Behavioral:  Negative for dysphoric mood. The patient is not nervous/anxious.     Patient Active Problem List   Diagnosis Date Noted   Endometrial cancer ([place]) 07/11/2016   Status post hysterectomy 09/27/2014   Endometrial intraepithelial neoplasia (EIN) 09/06/2014    Allergies  Allergen Reactions   Codeine Other (See Comments)    Vasovagal response- hypotensive   Penicillins    Sulfa Antibiotics     Past Surgical History:  Procedure Laterality Date   ABDOMINAL HYSTERECTOMY     CERVICAL POLYPECTOMY     INGUINAL HERNIA REPAIR Left    LAPAROSCOPIC HYSTERECTOMY N/A 09/27/2014   Procedure: HYSTERECTOMY TOTAL LAPAROSCOPIC;  Surgeon: Prentice Docker, MD;  Location: [facility];  Service: Gynecology;  Laterality: N/A;   LAPAROTOMY N/A 09/27/2014   Procedure: LAPAROTOMY;  Surgeon: Mellody Drown, MD;  Location: [facility];  Service: Gynecology;  Laterality: N/A;   LYMPH NODE BIOPSY N/A 09/27/2014   Procedure: LYMPH NODE BIOPSY;  Surgeon:  Mellody Drown, MD;  Location: [facility];  Service: Gynecology;  Laterality: N/A;   MYOMECTOMY     PELVIC LYMPH NODE DISSECTION N/A 09/27/2014   Procedure: PELVIC LYMPH NODE DISSECTION;  Surgeon: Mellody Drown, MD;  Location: [facility];  Service: Gynecology;  Laterality: N/A;    Social History   Tobacco Use   Smoking status: Never   Smokeless tobacco: Never   Tobacco comments:    2 cigarettes a week, during college. Smoke for 4  years during college  Vaping Use   Vaping Use: Never used  Substance Use Topics   Alcohol use: Yes    Alcohol/week: 1.0 standard drink of alcohol    Types: 1 Glasses of wine per week    Comment: glass a day   Drug use: No     Medication list has been reviewed and updated.  No outpatient medications have been marked as taking for the 12/25/21 encounter (Office Visit) with Juline Patch, MD.       12/25/2021    8:38 AM 05/09/2021    8:42 AM 08/22/2020    2:13 PM 08/23/2019    2:48 PM  GAD 7 : Generalized Anxiety Score  Nervous, Anxious, on Edge 0 0 0 2  Control/stop worrying 0 0 0 0  Worry too much - different things 0 0 0 0  Trouble relaxing 0 0 0 0  Restless 0 0 0 0  Easily annoyed or irritable 0 0 0 0  Afraid - awful might happen 0 0 0 0  Total GAD 7 Score 0 0 0 2  Anxiety Difficulty Not difficult at all Not difficult at all         12/25/2021    8:38 AM 05/09/2021    8:42 AM 08/22/2020    2:11 PM  Depression screen PHQ 2/9  Decreased Interest 0 0 0  Down, Depressed, Hopeless 0 0 0  PHQ - 2 Score 0 0 0  Altered sleeping 0 0 0  Tired, decreased energy 0 0 0  Change in appetite 0 0 0  Feeling bad or failure about yourself  0 0 0  Trouble concentrating 0 0 0  Moving slowly or fidgety/restless 0 0 0  Suicidal thoughts 0 0 0  PHQ-9 Score 0 0 0  Difficult doing work/chores Not difficult at all Not difficult at all     BP Readings from Last 3 Encounters:  12/25/21 122/78  05/21/21 128/78  05/09/21 130/80    Physical Exam Vitals and nursing note reviewed. Exam conducted with a chaperone present.  Constitutional:      General: She is not in acute distress.    Appearance: She is not diaphoretic.  HENT:     Head: Normocephalic and atraumatic.     Right Ear: Tympanic membrane and external ear normal.     Left Ear: Tympanic membrane and external ear normal.     Nose: Nose normal.     Mouth/Throat:     Mouth: Mucous membranes are moist.  Eyes:     General:         Right eye: No discharge.        Left eye: No discharge.     Conjunctiva/sclera: Conjunctivae normal.     Pupils: Pupils are equal, round, and reactive to light.  Neck:     Thyroid: No thyromegaly.     Vascular: No JVD.  Cardiovascular:     Rate and Rhythm: Normal rate and regular rhythm.  Heart sounds: Normal heart sounds. No murmur heard.    No friction rub. No gallop.  Pulmonary:     Effort: Pulmonary effort is normal.     Breath sounds: Normal breath sounds.  Abdominal:     Palpations: Abdomen is soft. There is no hepatomegaly, splenomegaly or mass.     Tenderness: There is no abdominal tenderness. There is no guarding.  Musculoskeletal:        General: Normal range of motion.     Cervical back: Neck supple.  Lymphadenopathy:     Cervical: No cervical adenopathy.  Skin:    General: Skin is warm and dry.  Neurological:     Mental Status: She is alert.     Wt Readings from Last 3 Encounters:  12/25/21 185 lb (83.9 kg)  05/21/21 181 lb (82.1 kg)  05/09/21 180 lb (81.6 kg)    BP 122/78   Pulse 68   Ht 5' 7"  (1.702 m)   Wt 185 lb (83.9 kg)   BMI 28.98 kg/m   Assessment and Plan:  1. Mixed hyperlipidemia Chronic.  Controlled.  Stable.  Last noted for the first time the elevation in triglycerides and LDL.  This may have been a supplement to a keto diet that the patient was taken and we will repeat today fasting and determine from there in the meantime I have given her Mediterranean diet for both weight loss and hopefully lipid control. - Lipid Panel With LDL/HDL Ratio - Comprehensive Metabolic Panel (CMET)  2. BMI 28.0-28.9,adult Health risks of being over weight were discussed and patient was counseled on weight loss options and exercise.  - Comprehensive Metabolic Panel (CMET)  3. Need for immunization against influenza Discussed and administered - Flu Vaccine QUAD 42moIM (Fluarix, Fluzone & Alfiuria Quad PF)    DOtilio Miu MD

## 2021-12-25 NOTE — Patient Instructions (Signed)
Why follow it? Research shows. Those who follow the Mediterranean diet have a reduced risk of heart disease  The diet is associated with a reduced incidence of Parkinson's and Alzheimer's diseases People following the diet may have longer life expectancies and lower rates of chronic diseases  The Dietary Guidelines for Americans recommends the Mediterranean diet as an eating plan to promote health and prevent disease  What Is the Mediterranean Diet?  Healthy eating plan based on typical foods and recipes of Mediterranean-style cooking The diet is primarily a plant based diet; these foods should make up a majority of meals   Starches - Plant based foods should make up a majority of meals - They are an important sources of vitamins, minerals, energy, antioxidants, and fiber - Choose whole grains, foods high in fiber and minimally processed items  - Typical grain sources include wheat, oats, barley, corn, brown rice, bulgar, farro, millet, polenta, couscous  - Various types of beans include chickpeas, lentils, fava beans, black beans, white beans   Fruits  Veggies - Large quantities of antioxidant rich fruits & veggies; 6 or more servings  - Vegetables can be eaten raw or lightly drizzled with oil and cooked  - Vegetables common to the traditional Mediterranean Diet include: artichokes, arugula, beets, broccoli, brussel sprouts, cabbage, carrots, celery, collard greens, cucumbers, eggplant, kale, leeks, lemons, lettuce, mushrooms, okra, onions, peas, peppers, potatoes, pumpkin, radishes, rutabaga, shallots, spinach, sweet potatoes, turnips, zucchini - Fruits common to the Mediterranean Diet include: apples, apricots, avocados, cherries, clementines, dates, figs, grapefruits, grapes, melons, nectarines, oranges, peaches, pears, pomegranates, strawberries, tangerines  Fats - Replace butter and margarine with healthy oils, such as olive oil, canola oil, and tahini  - Limit nuts to no more than a  handful a day  - Nuts include walnuts, almonds, pecans, pistachios, pine nuts  - Limit or avoid candied, honey roasted or heavily salted nuts - Olives are central to the Mediterranean diet - can be eaten whole or used in a variety of dishes   Meats Protein - Limiting red meat: no more than a few times a month - When eating red meat: choose lean cuts and keep the portion to the size of deck of cards - Eggs: approx. 0 to 4 times a week  - Fish and lean poultry: at least 2 a week  - Healthy protein sources include, chicken, turkey, lean beef, lamb - Increase intake of seafood such as tuna, salmon, trout, mackerel, shrimp, scallops - Avoid or limit high fat processed meats such as sausage and bacon  Dairy - Include moderate amounts of low fat dairy products  - Focus on healthy dairy such as fat free yogurt, skim milk, low or reduced fat cheese - Limit dairy products higher in fat such as whole or 2% milk, cheese, ice cream  Alcohol - Moderate amounts of red wine is ok  - No more than 5 oz daily for women (all ages) and men older than age 65  - No more than 10 oz of wine daily for men younger than 65  Other - Limit sweets and other desserts  - Use herbs and spices instead of salt to flavor foods  - Herbs and spices common to the traditional Mediterranean Diet include: basil, bay leaves, chives, cloves, cumin, fennel, garlic, lavender, marjoram, mint, oregano, parsley, pepper, rosemary, sage, savory, sumac, tarragon, thyme   It's not just a diet, it's a lifestyle:  The Mediterranean diet includes lifestyle factors typical of those in the   region  Foods, drinks and meals are best eaten with others and savored Daily physical activity is important for overall good health This could be strenuous exercise like running and aerobics This could also be more leisurely activities such as walking, housework, yard-work, or taking the stairs Moderation is the key; a balanced and healthy diet accommodates most  foods and drinks Consider portion sizes and frequency of consumption of certain foods   Meal Ideas & Options:  Breakfast:  Whole wheat toast or whole wheat English muffins with peanut butter & hard boiled egg Steel cut oats topped with apples & cinnamon and skim milk  Fresh fruit: banana, strawberries, melon, berries, peaches  Smoothies: strawberries, bananas, greek yogurt, peanut butter Low fat greek yogurt with blueberries and granola  Egg white omelet with spinach and mushrooms Breakfast couscous: whole wheat couscous, apricots, skim milk, cranberries  Sandwiches:  Hummus and grilled vegetables (peppers, zucchini, squash) on whole wheat bread   Grilled chicken on whole wheat pita with lettuce, tomatoes, cucumbers or tzatziki  Tuna salad on whole wheat bread: tuna salad made with greek yogurt, olives, red peppers, capers, green onions Garlic rosemary lamb pita: lamb sauted with garlic, rosemary, salt & pepper; add lettuce, cucumber, greek yogurt to pita - flavor with lemon juice and black pepper  Seafood:  Mediterranean grilled salmon, seasoned with garlic, basil, parsley, lemon juice and black pepper Shrimp, lemon, and spinach whole-grain pasta salad made with low fat greek yogurt  Seared scallops with lemon orzo  Seared tuna steaks seasoned salt, pepper, coriander topped with tomato mixture of olives, tomatoes, olive oil, minced garlic, parsley, green onions and cappers  Meats:  Herbed greek chicken salad with kalamata olives, cucumber, feta  Red bell peppers stuffed with spinach, bulgur, lean ground beef (or lentils) & topped with feta   Kebabs: skewers of chicken, tomatoes, onions, zucchini, squash  Turkey burgers: made with red onions, mint, dill, lemon juice, feta cheese topped with roasted red peppers Vegetarian Cucumber salad: cucumbers, artichoke hearts, celery, red onion, feta cheese, tossed in olive oil & lemon juice  Hummus and whole grain pita points with a greek salad  (lettuce, tomato, feta, olives, cucumbers, red onion) Lentil soup with celery, carrots made with vegetable broth, garlic, salt and pepper  Tabouli salad: parsley, bulgur, mint, scallions, cucumbers, tomato, radishes, lemon juice, olive oil, salt and pepper.     Mediterranean Diet A Mediterranean diet refers to food and lifestyle choices that are based on the traditions of countries located on the [place]. It focuses on eating more fruits, vegetables, whole grains, beans, nuts, seeds, and heart-healthy fats, and eating less dairy, meat, eggs, and processed foods with added sugar, salt, and fat. This way of eating has been shown to help prevent certain conditions and improve outcomes for people who have chronic diseases, like kidney disease and heart disease. What are tips for following this plan? Reading food labels Check the serving size of packaged foods. For foods such as rice and pasta, the serving size refers to the amount of cooked product, not dry. Check the total fat in packaged foods. Avoid foods that have saturated fat or trans fats. Check the ingredient list for added sugars, such as corn syrup. Shopping  Buy a variety of foods that offer a balanced diet, including: Fresh fruits and vegetables (produce). Grains, beans, nuts, and seeds. Some of these may be available in unpackaged forms or large amounts (in bulk). Fresh seafood. Poultry and eggs. Low-fat dairy products. Buy whole   ingredients instead of prepackaged foods. Buy fresh fruits and vegetables in-season from local farmers markets. Buy plain frozen fruits and vegetables. If you do not have access to quality fresh seafood, buy precooked frozen shrimp or canned fish, such as tuna, salmon, or sardines. Stock your pantry so you always have certain foods on hand, such as olive oil, canned tuna, canned tomatoes, rice, pasta, and beans. Cooking Cook foods with extra-virgin olive oil instead of using butter or other  vegetable oils. Have meat as a side dish, and have vegetables or grains as your main dish. This means having meat in small portions or adding small amounts of meat to foods like pasta or stew. Use beans or vegetables instead of meat in common dishes like chili or lasagna. Experiment with different cooking methods. Try roasting, broiling, steaming, and sauting vegetables. Add frozen vegetables to soups, stews, pasta, or rice. Add nuts or seeds for added healthy fats and plant protein at each meal. You can add these to yogurt, salads, or vegetable dishes. Marinate fish or vegetables using olive oil, lemon juice, garlic, and fresh herbs. Meal planning Plan to eat one vegetarian meal one day each week. Try to work up to two vegetarian meals, if possible. Eat seafood two or more times a week. Have healthy snacks readily available, such as: Vegetable sticks with hummus. Greek yogurt. Fruit and nut trail mix. Eat balanced meals throughout the week. This includes: Fruit: 2-3 servings a day. Vegetables: 4-5 servings a day. Low-fat dairy: 2 servings a day. Fish, poultry, or lean meat: 1 serving a day. Beans and legumes: 2 or more servings a week. Nuts and seeds: 1-2 servings a day. Whole grains: 6-8 servings a day. Extra-virgin olive oil: 3-4 servings a day. Limit red meat and sweets to only a few servings a month. Lifestyle  Cook and eat meals together with your family, when possible. Drink enough fluid to keep your urine pale yellow. Be physically active every day. This includes: Aerobic exercise like running or swimming. Leisure activities like gardening, walking, or housework. Get 7-8 hours of sleep each night. If recommended by your health care provider, drink red wine in moderation. This means 1 glass a day for nonpregnant women and 2 glasses a day for men. A glass of wine equals 5 oz (150 mL). What foods should I eat? Fruits Apples. Apricots. Avocado. Berries. Bananas. Cherries.  Dates. Figs. Grapes. Lemons. Melon. Oranges. Peaches. Plums. Pomegranate. Vegetables Artichokes. Beets. Broccoli. Cabbage. Carrots. Eggplant. Green beans. Chard. Kale. Spinach. Onions. Leeks. Peas. Squash. Tomatoes. Peppers. Radishes. Grains Whole-grain pasta. Brown rice. Bulgur wheat. Polenta. Couscous. Whole-wheat bread. Oatmeal. Quinoa. Meats and other proteins Beans. Almonds. Sunflower seeds. Pine nuts. Peanuts. Cod. Salmon. Scallops. Shrimp. Tuna. Tilapia. Clams. Oysters. Eggs. Poultry without skin. Dairy Low-fat milk. Cheese. Greek yogurt. Fats and oils Extra-virgin olive oil. Avocado oil. Grapeseed oil. Beverages Water. Red wine. Herbal tea. Sweets and desserts Greek yogurt with honey. Baked apples. Poached pears. Trail mix. Seasonings and condiments Basil. Cilantro. Coriander. Cumin. Mint. Parsley. Sage. Rosemary. Tarragon. Garlic. Oregano. Thyme. Pepper. Balsamic vinegar. Tahini. Hummus. Tomato sauce. Olives. Mushrooms. The items listed above may not be a complete list of foods and beverages you can eat. Contact a dietitian for more information. What foods should I limit? This is a list of foods that should be eaten rarely or only on special occasions. Fruits Fruit canned in syrup. Vegetables Deep-fried potatoes (french fries). Grains Prepackaged pasta or rice dishes. Prepackaged cereal with added sugar. Prepackaged snacks with   added sugar. Meats and other proteins Beef. Pork. Lamb. Poultry with skin. Hot dogs. Bacon. Dairy Ice cream. Sour cream. Whole milk. Fats and oils Butter. Canola oil. Vegetable oil. Beef fat (tallow). Lard. Beverages Juice. Sugar-sweetened soft drinks. Beer. Liquor and spirits. Sweets and desserts Cookies. Cakes. Pies. Candy. Seasonings and condiments Mayonnaise. Pre-made sauces and marinades. The items listed above may not be a complete list of foods and beverages you should limit. Contact a dietitian for more information. Summary The  Mediterranean diet includes both food and lifestyle choices. Eat a variety of fresh fruits and vegetables, beans, nuts, seeds, and whole grains. Limit the amount of red meat and sweets that you eat. If recommended by your health care provider, drink red wine in moderation. This means 1 glass a day for nonpregnant women and 2 glasses a day for men. A glass of wine equals 5 oz (150 mL). This information is not intended to replace advice given to you by your health care provider. Make sure you discuss any questions you have with your health care provider. Document Revised: 04/29/2019 Document Reviewed: 02/24/2019 Elsevier Patient Education  2023 [employer]  

## 2021-12-26 ENCOUNTER — Telehealth: Payer: Self-pay | Admitting: Family Medicine

## 2021-12-26 LAB — LIPID PANEL WITH LDL/HDL RATIO
Cholesterol, Total: 239 mg/dL — ABNORMAL HIGH (ref 100–199)
HDL: 76 mg/dL (ref >39)
LDL Chol Calc (NIH): 150 mg/dL — ABNORMAL HIGH (ref 0–99)
LDL/HDL Ratio: 2 ratio (ref 0.0–3.2)
Triglycerides: 76 mg/dL (ref 0–149)
VLDL Cholesterol Cal: 13 mg/dL (ref 5–40)

## 2021-12-26 LAB — COMPREHENSIVE METABOLIC PANEL
ALT: 11 IU/L (ref 0–32)
AST: 15 IU/L (ref 0–40)
Albumin/Globulin Ratio: 2.1 (ref 1.2–2.2)
Albumin: 4.4 g/dL (ref 3.9–4.9)
Alkaline Phosphatase: 40 IU/L — ABNORMAL LOW (ref 44–121)
BUN/Creatinine Ratio: 29 — ABNORMAL HIGH (ref 12–28)
BUN: 22 mg/dL (ref 8–27)
Bilirubin Total: 0.5 mg/dL (ref 0.0–1.2)
CO2: 24 mmol/L (ref 20–29)
Calcium: 9.1 mg/dL (ref 8.7–10.3)
Chloride: 103 mmol/L (ref 96–106)
Creatinine, Ser: 0.77 mg/dL (ref 0.57–1.00)
Globulin, Total: 2.1 g/dL (ref 1.5–4.5)
Glucose: 95 mg/dL (ref 70–99)
Potassium: 4.5 mmol/L (ref 3.5–5.2)
Sodium: 141 mmol/L (ref 134–144)
Total Protein: 6.5 g/dL (ref 6.0–8.5)
eGFR: 88 mL/min/{1.73_m2} (ref >59)

## 2021-12-26 NOTE — Telephone Encounter (Signed)
Noted  KP 

## 2021-12-26 NOTE — Telephone Encounter (Signed)
Copied from [place] [id number]. Topic: General - Inquiry >> Dec 26, 2021 10:02 AM Penni Bombard wrote: Reason for CRM: Pr called back saying she wants to try the diet before going on meds for cholestrol.

## 2022-01-15 DIAGNOSIS — Z872 Personal history of diseases of the skin and subcutaneous tissue: Secondary | ICD-10-CM | POA: Diagnosis not present

## 2022-01-15 DIAGNOSIS — Z86018 Personal history of other benign neoplasm: Secondary | ICD-10-CM | POA: Diagnosis not present

## 2022-01-15 DIAGNOSIS — L578 Other skin changes due to chronic exposure to nonionizing radiation: Secondary | ICD-10-CM | POA: Diagnosis not present

## 2022-03-24 ENCOUNTER — Encounter: Payer: Self-pay | Admitting: Family Medicine

## 2022-03-24 ENCOUNTER — Ambulatory Visit: Payer: Federal, State, Local not specified - PPO | Admitting: Family Medicine

## 2022-03-24 VITALS — BP 120/70 | HR 67 | Temp 98.2°F | Ht 67.0 in | Wt 179.0 lb

## 2022-03-24 DIAGNOSIS — E782 Mixed hyperlipidemia: Secondary | ICD-10-CM | POA: Diagnosis not present

## 2022-03-24 DIAGNOSIS — J020 Streptococcal pharyngitis: Secondary | ICD-10-CM | POA: Diagnosis not present

## 2022-03-24 DIAGNOSIS — B359 Dermatophytosis, unspecified: Secondary | ICD-10-CM

## 2022-03-24 MED ORDER — CLOTRIMAZOLE-BETAMETHASONE 1-0.05 % EX CREA: 1.0000 | TOPICAL_CREAM | Freq: Every day | CUTANEOUS | 0 refills | Status: DC

## 2022-03-24 MED ORDER — AZITHROMYCIN 250 MG PO TABS: ORAL_TABLET | ORAL | 0 refills | Status: AC

## 2022-03-24 NOTE — Addendum Note (Signed)
Addended by: Otilio Miu C on: 03/24/2022 11:50 AM   Modules accepted: Orders

## 2022-03-24 NOTE — Progress Notes (Addendum)
Date:  03/24/2022   Name:  Grace Sanchez   DOB:  [birth year]   MRN:  [id number]   Chief Complaint: Cough (Sorethroat, congestion, cough, postnasal drip. Saturday throat started to hurt, painful to swallow yesterday.)  Sore Throat  This is a new problem. The current episode started 1 to 4 weeks ago. The problem has been waxing and waning. The pain is worse on the right side. There has been no fever. The pain is moderate. Associated symptoms include congestion, ear pain, a hoarse voice, a plugged ear sensation and neck pain. Pertinent negatives include no abdominal pain, coughing, drooling, headaches, shortness of breath, swollen glands or trouble swallowing. Associated symptoms comments: Postnasal drainage. She has had no exposure to strep or mono. She has tried nothing for the symptoms.    Lab Results  Component Value Date   NA 141 12/25/2021   K 4.5 12/25/2021   CO2 24 12/25/2021   GLUCOSE 95 12/25/2021   BUN 22 12/25/2021   CREATININE 0.77 12/25/2021   CALCIUM 9.1 12/25/2021   EGFR 88 12/25/2021   GFRNONAA 87 01/12/2017   Lab Results  Component Value Date   CHOL 239 (H) 12/25/2021   HDL 76 12/25/2021   LDLCALC 150 (H) 12/25/2021   TRIG 76 12/25/2021   Lab Results  Component Value Date   TSH 1.530 08/22/2020   Lab Results  Component Value Date   HGBA1C 5.3 08/22/2020   Lab Results  Component Value Date   WBC 7.6 08/22/2020   HGB 14.9 08/22/2020   HCT 43.7 08/22/2020   MCV 88 08/22/2020   PLT 349 08/22/2020   Lab Results  Component Value Date   ALT 11 12/25/2021   AST 15 12/25/2021   ALKPHOS 40 (L) 12/25/2021   BILITOT 0.5 12/25/2021   Lab Results  Component Value Date   VD25OH 26.9 (L) 01/12/2017     Review of Systems  HENT:  Positive for congestion, ear pain, hoarse voice, postnasal drip, rhinorrhea and sore throat. Negative for drooling, hearing loss, nosebleeds, sinus pressure, sneezing and trouble swallowing.   Respiratory:  Negative for cough,  choking, chest tightness and shortness of breath.   Cardiovascular:  Negative for chest pain, palpitations and leg swelling.  Gastrointestinal:  Negative for abdominal pain.  Musculoskeletal:  Positive for neck pain.  Neurological:  Negative for headaches.    Patient Active Problem List   Diagnosis Date Noted   Endometrial cancer ([place]) 07/11/2016   Status post hysterectomy 09/27/2014   Endometrial intraepithelial neoplasia ([facility]) 09/06/2014    Allergies  Allergen Reactions   Codeine Other (See Comments)    Vasovagal response- hypotensive   Penicillins    Sulfa Antibiotics     Past Surgical History:  Procedure Laterality Date   ABDOMINAL HYSTERECTOMY     CERVICAL POLYPECTOMY     INGUINAL HERNIA REPAIR Left    LAPAROSCOPIC HYSTERECTOMY N/A 09/27/2014   Procedure: HYSTERECTOMY TOTAL LAPAROSCOPIC;  Surgeon: Prentice Docker, MD;  Location: [facility] ORS;  Service: Gynecology;  Laterality: N/A;   LAPAROTOMY N/A 09/27/2014   Procedure: LAPAROTOMY;  Surgeon: Mellody Drown, MD;  Location: [facility] ORS;  Service: Gynecology;  Laterality: N/A;   LYMPH NODE BIOPSY N/A 09/27/2014   Procedure: LYMPH NODE BIOPSY;  Surgeon: Mellody Drown, MD;  Location: [facility] ORS;  Service: Gynecology;  Laterality: N/A;   MYOMECTOMY     PELVIC LYMPH NODE DISSECTION N/A 09/27/2014   Procedure: PELVIC LYMPH NODE DISSECTION;  Surgeon: Mellody Drown, MD;  Location: [facility]  ORS;  Service: Gynecology;  Laterality: N/A;    Social History   Tobacco Use   Smoking status: Never   Smokeless tobacco: Never   Tobacco comments:    2 cigarettes a week, during college. Smoke for 4 years during college  Vaping Use   Vaping Use: Never used  Substance Use Topics   Alcohol use: Yes    Alcohol/week: 1.0 standard drink of alcohol    Types: 1 Glasses of wine per week    Comment: glass a day   Drug use: No     Medication list has been reviewed and updated.  No outpatient medications have been marked as taking for the 03/24/22  encounter (Office Visit) with Juline Patch, MD.       03/24/2022   11:04 AM 12/25/2021    8:38 AM 05/09/2021    8:42 AM 08/22/2020    2:13 PM  GAD 7 : Generalized Anxiety Score  Nervous, Anxious, on Edge  0 0 0  Control/stop worrying 0 0 0 0  Worry too much - different things 0 0 0 0  Trouble relaxing 0 0 0 0  Restless 0 0 0 0  Easily annoyed or irritable 0 0 0 0  Afraid - awful might happen 0 0 0 0  Total GAD 7 Score  0 0 0  Anxiety Difficulty Not difficult at all Not difficult at all Not difficult at all        03/24/2022   11:04 AM 12/25/2021    8:38 AM 05/09/2021    8:42 AM  Depression screen PHQ 2/9  Decreased Interest 0 0 0  Down, Depressed, Hopeless 0 0 0  PHQ - 2 Score 0 0 0  Altered sleeping 0 0 0  Tired, decreased energy 0 0 0  Change in appetite 0 0 0  Feeling bad or failure about yourself  0 0 0  Trouble concentrating 0 0 0  Moving slowly or fidgety/restless 0 0 0  Suicidal thoughts 0 0 0  PHQ-9 Score 0 0 0  Difficult doing work/chores Not difficult at all Not difficult at all Not difficult at all    BP Readings from Last 3 Encounters:  03/24/22 120/70  12/25/21 122/78  05/21/21 128/78    Physical Exam Vitals and nursing note reviewed. Exam conducted with a chaperone present.  Constitutional:      General: She is not in acute distress.    Appearance: She is not diaphoretic.  HENT:     Head: Normocephalic and atraumatic.     Right Ear: Tympanic membrane, ear canal and external ear normal.     Left Ear: Tympanic membrane, ear canal and external ear normal.     Nose: Nose normal. No congestion or rhinorrhea.     Mouth/Throat:     Mouth: Mucous membranes are moist.     Pharynx: Posterior oropharyngeal erythema present. No oropharyngeal exudate.  Eyes:     General:        Right eye: No discharge.        Left eye: No discharge.     Conjunctiva/sclera: Conjunctivae normal.     Pupils: Pupils are equal, round, and reactive to light.  Neck:      Thyroid: No thyromegaly.     Vascular: No JVD.  Cardiovascular:     Rate and Rhythm: Normal rate and regular rhythm.     Pulses: Normal pulses.     Heart sounds: Normal heart sounds, S1 normal and S2 normal.  No murmur heard.    No systolic murmur is present.     No diastolic murmur is present.     No friction rub. No gallop. No S3 or S4 sounds.  Pulmonary:     Effort: Pulmonary effort is normal.     Breath sounds: Normal breath sounds. No wheezing or rhonchi.  Abdominal:     General: Bowel sounds are normal.     Palpations: Abdomen is soft. There is no hepatomegaly, splenomegaly or mass.     Tenderness: There is no abdominal tenderness. There is no guarding.  Musculoskeletal:        General: Normal range of motion.     Cervical back: Normal range of motion and neck supple.  Lymphadenopathy:     Head:     Right side of head: Submandibular and tonsillar adenopathy present.     Left side of head: Submandibular and tonsillar adenopathy present.     Cervical: No cervical adenopathy.     Comments: tender  Skin:    General: Skin is warm and dry.     Findings: Erythema and rash present.       Neurological:     Mental Status: She is alert.     Wt Readings from Last 3 Encounters:  03/24/22 179 lb (81.2 kg)  12/25/21 185 lb (83.9 kg)  05/21/21 181 lb (82.1 kg)    BP 120/70   Pulse 67   Temp 98.2 F (36.8 C) (Oral)   Ht _0  (1.702 m)   Wt 179 lb (81.2 kg)   SpO2 97%   BMI 28.04 kg/m   Assessment and Plan: 1. Strep pharyngitis New onset.  Persistent.  Sore throat with tender lymphadenopathy  Strep test is positive for strep.  Patient is allergic to penicillin and will treat with azithromycin to 52 tablets today followed by 1 a day for 4 days. - azithromycin (ZITHROMAX) 250 MG tablet; Take 2 tablets on day 1, then 1 tablet daily on days 2 through 5  Dispense: 6 tablet; Refill: 0  2. Tinea New onset.  Pruritic.  Has some characteristics of tinea we will treat with  Lotrisone twice a day for 5 days. - clotrimazole-betamethasone (LOTRISONE) cream; Apply 1 Application topically daily.  Dispense: 30 g; Refill: 0  3. Mixed hyperlipidemia Chronic.  Treated with diet.  Will check lipid panel fasting tomorrow. - Lipid Panel With LDL/HDL Ratio     Otilio Miu, MD

## 2022-03-25 DIAGNOSIS — E782 Mixed hyperlipidemia: Secondary | ICD-10-CM | POA: Diagnosis not present

## 2022-03-26 LAB — LIPID PANEL WITH LDL/HDL RATIO
Cholesterol, Total: 218 mg/dL — ABNORMAL HIGH (ref 100–199)
HDL: 71 mg/dL (ref >39)
LDL Chol Calc (NIH): 134 mg/dL — ABNORMAL HIGH (ref 0–99)
LDL/HDL Ratio: 1.9 ratio (ref 0.0–3.2)
Triglycerides: 74 mg/dL (ref 0–149)
VLDL Cholesterol Cal: 13 mg/dL (ref 5–40)

## 2022-04-14 DIAGNOSIS — K080 Exfoliation of teeth due to systemic causes: Secondary | ICD-10-CM | POA: Diagnosis not present

## 2022-09-25 ENCOUNTER — Encounter: Payer: Self-pay | Admitting: Family Medicine

## 2022-09-25 ENCOUNTER — Ambulatory Visit: Payer: Federal, State, Local not specified - PPO | Admitting: Family Medicine

## 2022-09-25 VITALS — BP 102/64 | HR 58 | Ht 67.0 in | Wt 179.0 lb

## 2022-09-25 DIAGNOSIS — E782 Mixed hyperlipidemia: Secondary | ICD-10-CM | POA: Diagnosis not present

## 2022-09-25 NOTE — Progress Notes (Signed)
Date:  09/25/2022   Name:  Grace Sanchez   DOB:  1961/03/08   MRN:  [id number]   Chief Complaint: Hyperlipidemia (Recheck cholesterol)  Hyperlipidemia This is a chronic problem. The current episode started more than 1 year ago. The problem is controlled. Recent lipid tests were reviewed and are normal. Pertinent negatives include no chest pain, focal sensory loss, focal weakness, leg pain, myalgias or shortness of breath. Current antihyperlipidemic treatment includes diet change. The current treatment provides mild improvement of lipids. There are no compliance problems.  Risk factors for coronary artery disease include dyslipidemia and hypertension.    Lab Results  Component Value Date   NA 141 12/25/2021   K 4.5 12/25/2021   CO2 24 12/25/2021   GLUCOSE 95 12/25/2021   BUN 22 12/25/2021   CREATININE 0.77 12/25/2021   CALCIUM 9.1 12/25/2021   EGFR 88 12/25/2021   GFRNONAA 87 01/12/2017   Lab Results  Component Value Date   CHOL 218 (H) 03/25/2022   HDL 71 03/25/2022   LDLCALC 134 (H) 03/25/2022   TRIG 74 03/25/2022   Lab Results  Component Value Date   TSH 1.530 08/22/2020   Lab Results  Component Value Date   HGBA1C 5.3 08/22/2020   Lab Results  Component Value Date   WBC 7.6 08/22/2020   HGB 14.9 08/22/2020   HCT 43.7 08/22/2020   MCV 88 08/22/2020   PLT 349 08/22/2020   Lab Results  Component Value Date   ALT 11 12/25/2021   AST 15 12/25/2021   ALKPHOS 40 (L) 12/25/2021   BILITOT 0.5 12/25/2021   Lab Results  Component Value Date   VD25OH 26.9 (L) 01/12/2017     Review of Systems  Constitutional:  Negative for unexpected weight change.  HENT:  Negative for sinus pressure and trouble swallowing.   Eyes:  Negative for visual disturbance.  Respiratory:  Negative for shortness of breath and wheezing.   Cardiovascular:  Negative for chest pain, palpitations and leg swelling.  Gastrointestinal:  Negative for abdominal pain, blood in stool and  constipation.  Endocrine: Negative for polydipsia and polyuria.  Genitourinary:  Negative for difficulty urinating.  Musculoskeletal:  Negative for myalgias.  Skin:  Negative for color change.  Neurological:  Negative for focal weakness.  Hematological:  Negative for adenopathy. Does not bruise/bleed easily.    Patient Active Problem List   Diagnosis Date Noted   Endometrial cancer (HCC) 07/11/2016   Status post hysterectomy 09/27/2014   Endometrial intraepithelial neoplasia (EIN) 09/06/2014    Allergies  Allergen Reactions   Codeine Other (See Comments)    Vasovagal response- hypotensive   Penicillins    Sulfa Antibiotics     Past Surgical History:  Procedure Laterality Date   ABDOMINAL HYSTERECTOMY     CERVICAL POLYPECTOMY     INGUINAL HERNIA REPAIR Left    LAPAROSCOPIC HYSTERECTOMY N/A 09/27/2014   Procedure: HYSTERECTOMY TOTAL LAPAROSCOPIC;  Surgeon: Thomasene Mohair, MD;  Location: [facility];  Service: Gynecology;  Laterality: N/A;   LAPAROTOMY N/A 09/27/2014   Procedure: LAPAROTOMY;  Surgeon: Leida Lauth, MD;  Location: [facility];  Service: Gynecology;  Laterality: N/A;   LYMPH NODE BIOPSY N/A 09/27/2014   Procedure: LYMPH NODE BIOPSY;  Surgeon: Leida Lauth, MD;  Location: [facility];  Service: Gynecology;  Laterality: N/A;   MYOMECTOMY     PELVIC LYMPH NODE DISSECTION N/A 09/27/2014   Procedure: PELVIC LYMPH NODE DISSECTION;  Surgeon: Leida Lauth, MD;  Location: [facility];  Service: Gynecology;  Laterality: N/A;    Social History   Tobacco Use   Smoking status: Never   Smokeless tobacco: Never   Tobacco comments:    2 cigarettes a week, during college. Smoke for 4 years during college  Vaping Use   Vaping Use: Never used  Substance Use Topics   Alcohol use: Yes    Alcohol/week: 1.0 standard drink of alcohol    Types: 1 Glasses of wine per week    Comment: glass a day   Drug use: No     Medication list has been reviewed and updated.  No outpatient  medications have been marked as taking for the 09/25/22 encounter (Office Visit) with Duanne Limerick, MD.       09/25/2022    8:10 AM 03/24/2022   11:04 AM 12/25/2021    8:38 AM 05/09/2021    8:42 AM  GAD 7 : Generalized Anxiety Score  Nervous, Anxious, on Edge 0  0 0  Control/stop worrying 0 0 0 0  Worry too much - different things 0 0 0 0  Trouble relaxing 0 0 0 0  Restless 0 0 0 0  Easily annoyed or irritable 0 0 0 0  Afraid - awful might happen 0 0 0 0  Total GAD 7 Score 0  0 0  Anxiety Difficulty Not difficult at all Not difficult at all Not difficult at all Not difficult at all       09/25/2022    8:10 AM 03/24/2022   11:04 AM 12/25/2021    8:38 AM  Depression screen PHQ 2/9  Decreased Interest 0 0 0  Down, Depressed, Hopeless 0 0 0  PHQ - 2 Score 0 0 0  Altered sleeping 0 0 0  Tired, decreased energy 0 0 0  Change in appetite 0 0 0  Feeling bad or failure about yourself  0 0 0  Trouble concentrating 0 0 0  Moving slowly or fidgety/restless 0 0 0  Suicidal thoughts 0 0 0  PHQ-9 Score 0 0 0  Difficult doing work/chores Not difficult at all Not difficult at all Not difficult at all    BP Readings from Last 3 Encounters:  09/25/22 102/64  03/24/22 120/70  12/25/21 122/78    Physical Exam Vitals and nursing note reviewed.  HENT:     Right Ear: Tympanic membrane, ear canal and external ear normal.     Left Ear: Tympanic membrane, ear canal and external ear normal.     Nose: Nose normal.     Mouth/Throat:     Mouth: Mucous membranes are moist.  Eyes:     Pupils: Pupils are equal, round, and reactive to light.  Cardiovascular:     Heart sounds: No murmur heard.    No gallop.  Pulmonary:     Breath sounds: No wheezing, rhonchi or rales.  Abdominal:     Palpations: There is no hepatomegaly or splenomegaly.     Tenderness: There is no abdominal tenderness.  Neurological:     Mental Status: She is alert.     Wt Readings from Last 3 Encounters:  09/25/22 179  lb (81.2 kg)  03/24/22 179 lb (81.2 kg)  12/25/21 185 lb (83.9 kg)    BP 102/64   Pulse (!) 58   Ht 5\' 7"  (1.702 m)   Wt 179 lb (81.2 kg)   SpO2 98%   BMI 28.04 kg/m   Assessment and Plan: 1. Mixed hyperlipidemia Chronic.  Controlled with  diet/Mediterranean.  Stable.  Currently approaching control with dietary management and as accessible exercise.  LDLs have been in the 1 30-1 40 range on evaluation with transaminases well in normal range.  Review of CT in 2014 makes no mention of steatosis.  Will check lipid panel and CMP for current level of control and hepatic stability. - Lipid Panel With LDL/HDL Ratio - Comprehensive Metabolic Panel (CMET)     Elizabeth Sauer, MD

## 2022-09-26 LAB — COMPREHENSIVE METABOLIC PANEL
ALT: 9 IU/L (ref 0–32)
AST: 14 IU/L (ref 0–40)
Albumin: 4.3 g/dL (ref 3.9–4.9)
Alkaline Phosphatase: 53 IU/L (ref 44–121)
BUN/Creatinine Ratio: 26 (ref 12–28)
BUN: 21 mg/dL (ref 8–27)
Bilirubin Total: 0.4 mg/dL (ref 0.0–1.2)
CO2: 26 mmol/L (ref 20–29)
Calcium: 9.5 mg/dL (ref 8.7–10.3)
Chloride: 105 mmol/L (ref 96–106)
Creatinine, Ser: 0.81 mg/dL (ref 0.57–1.00)
Globulin, Total: 2.1 g/dL (ref 1.5–4.5)
Glucose: 95 mg/dL (ref 70–99)
Potassium: 4.5 mmol/L (ref 3.5–5.2)
Sodium: 143 mmol/L (ref 134–144)
Total Protein: 6.4 g/dL (ref 6.0–8.5)
eGFR: 82 mL/min/{1.73_m2} (ref >59)

## 2022-09-26 LAB — LIPID PANEL WITH LDL/HDL RATIO
Cholesterol, Total: 233 mg/dL — ABNORMAL HIGH (ref 100–199)
HDL: 73 mg/dL (ref >39)
LDL Chol Calc (NIH): 143 mg/dL — ABNORMAL HIGH (ref 0–99)
LDL/HDL Ratio: 2 ratio (ref 0.0–3.2)
Triglycerides: 96 mg/dL (ref 0–149)
VLDL Cholesterol Cal: 17 mg/dL (ref 5–40)

## 2022-12-01 DIAGNOSIS — Z01419 Encounter for gynecological examination (general) (routine) without abnormal findings: Secondary | ICD-10-CM | POA: Diagnosis not present

## 2022-12-01 DIAGNOSIS — Z1231 Encounter for screening mammogram for malignant neoplasm of breast: Secondary | ICD-10-CM | POA: Diagnosis not present

## 2022-12-01 DIAGNOSIS — Z1339 Encounter for screening examination for other mental health and behavioral disorders: Secondary | ICD-10-CM | POA: Diagnosis not present

## 2022-12-01 DIAGNOSIS — Z1331 Encounter for screening for depression: Secondary | ICD-10-CM | POA: Diagnosis not present

## 2022-12-05 ENCOUNTER — Other Ambulatory Visit: Payer: Self-pay | Admitting: Obstetrics and Gynecology

## 2022-12-05 DIAGNOSIS — Z1231 Encounter for screening mammogram for malignant neoplasm of breast: Secondary | ICD-10-CM

## 2023-01-19 DIAGNOSIS — Z86018 Personal history of other benign neoplasm: Secondary | ICD-10-CM | POA: Diagnosis not present

## 2023-01-19 DIAGNOSIS — Z872 Personal history of diseases of the skin and subcutaneous tissue: Secondary | ICD-10-CM | POA: Diagnosis not present

## 2023-01-19 DIAGNOSIS — L578 Other skin changes due to chronic exposure to nonionizing radiation: Secondary | ICD-10-CM | POA: Diagnosis not present

## 2023-01-20 ENCOUNTER — Ambulatory Visit
Admission: RE | Admit: 2023-01-20 | Discharge: 2023-01-20 | Disposition: A | Discharge status: Home / Self Care | Payer: Federal, State, Local not specified - PPO | Source: Ambulatory Visit | Attending: Obstetrics and Gynecology | Admitting: Obstetrics and Gynecology

## 2023-01-20 DIAGNOSIS — Z1231 Encounter for screening mammogram for malignant neoplasm of breast: Secondary | ICD-10-CM | POA: Insufficient documentation

## 2023-02-20 ENCOUNTER — Telehealth: Payer: Federal, State, Local not specified - PPO | Admitting: Family Medicine

## 2023-02-20 ENCOUNTER — Ambulatory Visit: Payer: Self-pay | Admitting: *Deleted

## 2023-02-20 NOTE — Telephone Encounter (Signed)
  Chief Complaint: UTI   (Pt is in [place] on a camping trip in a very remote area)  She is requesting a virtual appt with Dr. Yetta Barre. Symptoms: Urine cloudy, smells bad, burning at the end of urinating and generalized discomfort.  Frequency: Started noticing mild signs day before yesterday.   They traveled all day yesterday so thinks the long periods of sitting may be contributing Pertinent Negatives: Patient denies fever that she can tell. Disposition: [] ED /[] Urgent Care (no appt availability in office) / [x] Appointment(In office/virtual)/ []  [facility]/ [] Home Care/ [] Refused Recommended Disposition /[]  Mobile Bus/ []  Follow-up with PCP Additional Notes: Made her an appt with Dr. Yetta Barre for today video visit for 10:20.    I encouraged her to log in at least 15 min. Early to be sure she doesn't have any difficulty with the call.   I let her know to call us if there was a problem since she is in a very remote area.   No problems with our phone call.     She has information handy for pharmacy she wants any rx called in to.

## 2023-02-20 NOTE — Telephone Encounter (Signed)
Message from [place] sent at 02/20/2023  8:58 AM EST  Summary: poss UTI   Pt states she is traveling and is in [place] at this moment.  Pt having cloudy, smelly urine, burning at end of stream and general discomfort.  Was hoping for a virtual appt today asap.          Call History  Contact Date/Time Type Contact Phone/Fax User  02/20/2023 08:55 AM EST Phone ([address] [place], [place] (Self) [phone] Judie Petit) Crist Infante   Reason for Disposition  Age > 50 years  Answer Assessment - Initial Assessment Questions 1. SEVERITY: "How bad is the pain?"  (e.g., Scale 1-10; mild, moderate, or severe)   - MILD (1-3): complains slightly about urination hurting   - MODERATE (4-7): interferes with normal activities     - SEVERE (8-10): excruciating, unwilling or unable to urinate because of the pain      I'm in [place] now camping.   I have an issue getting UTIs.   2. FREQUENCY: "How many times have you had painful urination today?"      It burns at the end of urination.   Yes having frequency 3. PATTERN: "Is pain present every time you urinate or just sometimes?"      Yes  4. ONSET: "When did the painful urination start?"      Started day before yesterday 5. FEVER: "Do you have a fever?" If Yes, ask: "What is your temperature, how was it measured, and when did it start?"     Don't think so 6. PAST UTI: "Have you had a urine infection before?" If Yes, ask: "When was the last time?" and "What happened that time?"      Yes 7. CAUSE: "What do you think is causing the painful urination?"  (e.g., UTI, scratch, Herpes sore)     UTI 8. OTHER SYMPTOMS: "Do you have any other symptoms?" (e.g., blood in urine, flank pain, genital sores, urgency, vaginal discharge)     Cloudy, smelly urine, burning at end of stream and general discomfot. 9. PREGNANCY: "Is there any chance you are pregnant?" "When was your last menstrual period?"     N/A  Protocols used: Urination Pain - Female-A-AH

## 2023-03-27 ENCOUNTER — Other Ambulatory Visit: Payer: Self-pay | Admitting: Family Medicine

## 2023-03-27 ENCOUNTER — Ambulatory Visit: Payer: Federal, State, Local not specified - PPO | Admitting: Family Medicine

## 2023-03-27 VITALS — BP 124/72 | HR 65 | Ht 67.0 in | Wt 180.6 lb

## 2023-03-27 DIAGNOSIS — E782 Mixed hyperlipidemia: Secondary | ICD-10-CM | POA: Diagnosis not present

## 2023-03-27 DIAGNOSIS — N3000 Acute cystitis without hematuria: Secondary | ICD-10-CM | POA: Diagnosis not present

## 2023-03-27 DIAGNOSIS — R8290 Unspecified abnormal findings in urine: Secondary | ICD-10-CM | POA: Diagnosis not present

## 2023-03-27 LAB — POCT URINALYSIS DIPSTICK
Bilirubin, UA: NEGATIVE
Blood, UA: NEGATIVE
Glucose, UA: NEGATIVE
Ketones, UA: NEGATIVE
Nitrite, UA: POSITIVE
Protein, UA: NEGATIVE
Spec Grav, UA: 1.02 (ref 1.010–1.025)
Urobilinogen, UA: 0.2 U/dL
pH, UA: 5 (ref 5.0–8.0)

## 2023-03-27 NOTE — Patient Instructions (Signed)

## 2023-03-27 NOTE — Progress Notes (Signed)
Date:  03/27/2023   Name:  Grace Sanchez   DOB:  [birth year]   MRN:  [id number]   Chief Complaint: Hyperlipidemia (Pt in clinic today c/o follow up for HLD. Pt had a Uti while she was out of town. New medication for that is updated.)  Hyperlipidemia This is a chronic problem. The current episode started more than 1 year ago. The problem is uncontrolled. Recent lipid tests were reviewed and are high. She has no history of chronic renal disease, diabetes, hypothyroidism, liver disease, obesity or nephrotic syndrome. There are no known factors aggravating her hyperlipidemia. Pertinent negatives include no chest pain, focal sensory loss, focal weakness, leg pain, myalgias or shortness of breath. Current antihyperlipidemic treatment includes diet change. The current treatment provides moderate improvement of lipids. There are no compliance problems.  Risk factors for coronary artery disease include dyslipidemia and hypertension.    Lab Results  Component Value Date   NA 143 09/25/2022   K 4.5 09/25/2022   CO2 26 09/25/2022   GLUCOSE 95 09/25/2022   BUN 21 09/25/2022   CREATININE 0.81 09/25/2022   CALCIUM 9.5 09/25/2022   EGFR 82 09/25/2022   GFRNONAA 87 01/12/2017   Lab Results  Component Value Date   CHOL 233 (H) 09/25/2022   HDL 73 09/25/2022   LDLCALC 143 (H) 09/25/2022   TRIG 96 09/25/2022   Lab Results  Component Value Date   TSH 1.530 08/22/2020   Lab Results  Component Value Date   HGBA1C 5.3 08/22/2020   Lab Results  Component Value Date   WBC 7.6 08/22/2020   HGB 14.9 08/22/2020   HCT 43.7 08/22/2020   MCV 88 08/22/2020   PLT 349 08/22/2020   Lab Results  Component Value Date   ALT 9 09/25/2022   AST 14 09/25/2022   ALKPHOS 53 09/25/2022   BILITOT 0.4 09/25/2022   Lab Results  Component Value Date   VD25OH 26.9 (L) 01/12/2017     Review of Systems  Constitutional:  Negative for fatigue and unexpected weight change.  HENT:  Negative for congestion,  facial swelling, rhinorrhea, sinus pressure, sinus pain and sneezing.   Eyes:  Negative for visual disturbance.  Respiratory:  Negative for cough, chest tightness and shortness of breath.   Cardiovascular:  Negative for chest pain, palpitations and leg swelling.  Gastrointestinal:  Negative for abdominal distention.  Endocrine: Negative for polydipsia and polyuria.  Genitourinary:  Negative for difficulty urinating.  Musculoskeletal:  Negative for arthralgias and myalgias.  Neurological:  Negative for focal weakness.    Patient Active Problem List   Diagnosis Date Noted   Endometrial cancer (HCC) 07/11/2016   Status post hysterectomy 09/27/2014   Endometrial intraepithelial neoplasia (EIN) 09/06/2014    Allergies  Allergen Reactions   Codeine Other (See Comments)    Vasovagal response- hypotensive   Penicillins    Sulfa Antibiotics     Past Surgical History:  Procedure Laterality Date   ABDOMINAL HYSTERECTOMY     CERVICAL POLYPECTOMY     INGUINAL HERNIA REPAIR Left    LAPAROSCOPIC HYSTERECTOMY N/A 09/27/2014   Procedure: HYSTERECTOMY TOTAL LAPAROSCOPIC;  Surgeon: Thomasene Mohair, MD;  Location: [facility];  Service: Gynecology;  Laterality: N/A;   LAPAROTOMY N/A 09/27/2014   Procedure: LAPAROTOMY;  Surgeon: Leida Lauth, MD;  Location: [facility];  Service: Gynecology;  Laterality: N/A;   LYMPH NODE BIOPSY N/A 09/27/2014   Procedure: LYMPH NODE BIOPSY;  Surgeon: Leida Lauth, MD;  Location: [facility];  Service: Gynecology;  Laterality: N/A;   MYOMECTOMY     PELVIC LYMPH NODE DISSECTION N/A 09/27/2014   Procedure: PELVIC LYMPH NODE DISSECTION;  Surgeon: Leida Lauth, MD;  Location: [facility];  Service: Gynecology;  Laterality: N/A;    Social History   Tobacco Use   Smoking status: Never   Smokeless tobacco: Never   Tobacco comments:    2 cigarettes a week, during college. Smoke for 4 years during college  Vaping Use   Vaping status: Never Used  Substance Use Topics    Alcohol use: Yes    Alcohol/week: 1.0 standard drink of alcohol    Types: 1 Glasses of wine per week    Comment: glass a day   Drug use: No     Medication list has been reviewed and updated.  Current Meds  Medication Sig   nitrofurantoin, macrocrystal-monohydrate, (MACROBID) 100 MG capsule Take 100 mg by mouth every 12 (twelve) hours.       03/27/2023    9:09 AM 09/25/2022    8:10 AM 03/24/2022   11:04 AM 12/25/2021    8:38 AM  GAD 7 : Generalized Anxiety Score  Nervous, Anxious, on Edge 0 0  0  Control/stop worrying 0 0 0 0  Worry too much - different things 0 0 0 0  Trouble relaxing 0 0 0 0  Restless 0 0 0 0  Easily annoyed or irritable 0 0 0 0  Afraid - awful might happen 0 0 0 0  Total GAD 7 Score 0 0  0  Anxiety Difficulty Not difficult at all Not difficult at all Not difficult at all Not difficult at all       03/27/2023    9:09 AM 09/25/2022    8:10 AM 03/24/2022   11:04 AM  Depression screen PHQ 2/9  Decreased Interest 0 0 0  Down, Depressed, Hopeless 0 0 0  PHQ - 2 Score 0 0 0  Altered sleeping 0 0 0  Tired, decreased energy 0 0 0  Change in appetite 0 0 0  Feeling bad or failure about yourself  0 0 0  Trouble concentrating 0 0 0  Moving slowly or fidgety/restless 0 0 0  Suicidal thoughts 0 0 0  PHQ-9 Score 0 0 0  Difficult doing work/chores Not difficult at all Not difficult at all Not difficult at all    BP Readings from Last 3 Encounters:  03/27/23 124/72  09/25/22 102/64  03/24/22 120/70    Physical Exam Vitals and nursing note reviewed. Exam conducted with a chaperone present.  Constitutional:      General: She is not in acute distress.    Appearance: She is not diaphoretic.  HENT:     Head: Normocephalic and atraumatic.     Right Ear: Tympanic membrane, ear canal and external ear normal.     Left Ear: Tympanic membrane, ear canal and external ear normal.     Nose: Nose normal. No congestion or rhinorrhea.     Mouth/Throat:     Mouth:  Mucous membranes are moist.     Pharynx: No oropharyngeal exudate or posterior oropharyngeal erythema.  Eyes:     General:        Right eye: No discharge.        Left eye: No discharge.     Conjunctiva/sclera: Conjunctivae normal.     Pupils: Pupils are equal, round, and reactive to light.  Neck:     Thyroid: No thyromegaly.  Vascular: No JVD.  Cardiovascular:     Rate and Rhythm: Normal rate and regular rhythm.     Heart sounds: Normal heart sounds. No murmur heard.    No friction rub. No gallop.  Pulmonary:     Effort: Pulmonary effort is normal.     Breath sounds: Normal breath sounds. No wheezing, rhonchi or rales.  Abdominal:     General: Bowel sounds are normal.     Palpations: Abdomen is soft. There is no hepatomegaly, splenomegaly or mass.     Tenderness: There is no abdominal tenderness. There is no right CVA tenderness, left CVA tenderness or guarding.  Musculoskeletal:        General: Normal range of motion.     Cervical back: Normal range of motion and neck supple.  Lymphadenopathy:     Cervical: No cervical adenopathy.  Skin:    General: Skin is warm and dry.  Neurological:     Mental Status: She is alert.     Deep Tendon Reflexes: Reflexes are normal and symmetric.     Wt Readings from Last 3 Encounters:  03/27/23 180 lb 9.6 oz (81.9 kg)  09/25/22 179 lb (81.2 kg)  03/24/22 179 lb (81.2 kg)    BP 124/72   Pulse 65   Ht 5\' 7"  (1.702 m)   Wt 180 lb 9.6 oz (81.9 kg)   SpO2 98%   BMI 28.29 kg/m   Assessment and Plan:  1. Abnormal urine findings (Primary) New onset.  Follow-up of earlier problem [place] of state.  Presently stable.  Asymptomatic.  Urine dipstick notes that there is nitrates and leukocytes.  We will send for culture and if culture is positive for single organism we will treat accordingly. - POCT urinalysis dipstick - Urine Culture  2. Mixed hyperlipidemia Chronic.  Controlled.  Stable.  Currently diet controlled only.  Will check lipid  panel for current level of LDL control and ALT to monitor for steatosis.  Will recheck patient and 6 to 12 months. - Lipid Panel With LDL/HDL Ratio - ALT    Elizabeth Sauer, MD

## 2023-03-28 ENCOUNTER — Encounter: Payer: Self-pay | Admitting: Family Medicine

## 2023-03-28 LAB — ALT: ALT: 10 [IU]/L (ref 0–32)

## 2023-03-28 LAB — LIPID PANEL WITH LDL/HDL RATIO
Cholesterol, Total: 248 mg/dL — ABNORMAL HIGH (ref 100–199)
HDL: 71 mg/dL (ref >39)
LDL Chol Calc (NIH): 162 mg/dL — ABNORMAL HIGH (ref 0–99)
LDL/HDL Ratio: 2.3 {ratio} (ref 0.0–3.2)
Triglycerides: 88 mg/dL (ref 0–149)
VLDL Cholesterol Cal: 15 mg/dL (ref 5–40)

## 2023-03-30 LAB — URINE CULTURE

## 2023-03-31 ENCOUNTER — Encounter: Payer: Self-pay | Admitting: Family Medicine

## 2023-06-29 DIAGNOSIS — M25532 Pain in left wrist: Secondary | ICD-10-CM | POA: Diagnosis not present

## 2023-06-29 DIAGNOSIS — M7989 Other specified soft tissue disorders: Secondary | ICD-10-CM | POA: Diagnosis not present

## 2023-06-29 DIAGNOSIS — S52572A Other intraarticular fracture of lower end of left radius, initial encounter for closed fracture: Secondary | ICD-10-CM | POA: Diagnosis not present

## 2023-06-29 DIAGNOSIS — M25522 Pain in left elbow: Secondary | ICD-10-CM | POA: Diagnosis not present

## 2023-06-29 DIAGNOSIS — S59902A Unspecified injury of left elbow, initial encounter: Secondary | ICD-10-CM | POA: Diagnosis not present

## 2023-06-29 DIAGNOSIS — Z90710 Acquired absence of both cervix and uterus: Secondary | ICD-10-CM | POA: Diagnosis not present

## 2023-06-29 DIAGNOSIS — S52502A Unspecified fracture of the lower end of left radius, initial encounter for closed fracture: Secondary | ICD-10-CM | POA: Diagnosis not present

## 2023-06-29 DIAGNOSIS — W11XXXA Fall on and from ladder, initial encounter: Secondary | ICD-10-CM | POA: Diagnosis not present

## 2023-06-30 DIAGNOSIS — S52502A Unspecified fracture of the lower end of left radius, initial encounter for closed fracture: Secondary | ICD-10-CM | POA: Diagnosis not present

## 2023-07-02 DIAGNOSIS — S52572A Other intraarticular fracture of lower end of left radius, initial encounter for closed fracture: Secondary | ICD-10-CM | POA: Diagnosis not present

## 2023-07-03 DIAGNOSIS — Z90710 Acquired absence of both cervix and uterus: Secondary | ICD-10-CM | POA: Diagnosis not present

## 2023-07-03 DIAGNOSIS — S52572A Other intraarticular fracture of lower end of left radius, initial encounter for closed fracture: Secondary | ICD-10-CM | POA: Diagnosis not present

## 2023-07-03 DIAGNOSIS — Z809 Family history of malignant neoplasm, unspecified: Secondary | ICD-10-CM | POA: Diagnosis not present

## 2023-07-03 DIAGNOSIS — K219 Gastro-esophageal reflux disease without esophagitis: Secondary | ICD-10-CM | POA: Diagnosis not present

## 2023-07-03 DIAGNOSIS — R519 Headache, unspecified: Secondary | ICD-10-CM | POA: Diagnosis not present

## 2023-07-03 DIAGNOSIS — Z885 Allergy status to narcotic agent status: Secondary | ICD-10-CM | POA: Diagnosis not present

## 2023-07-03 DIAGNOSIS — W11XXXA Fall on and from ladder, initial encounter: Secondary | ICD-10-CM | POA: Diagnosis not present

## 2023-07-03 DIAGNOSIS — G4733 Obstructive sleep apnea (adult) (pediatric): Secondary | ICD-10-CM | POA: Diagnosis not present

## 2023-07-03 DIAGNOSIS — Z880 Allergy status to penicillin: Secondary | ICD-10-CM | POA: Diagnosis not present

## 2023-07-03 DIAGNOSIS — Z882 Allergy status to sulfonamides status: Secondary | ICD-10-CM | POA: Diagnosis not present

## 2023-07-03 DIAGNOSIS — S52502A Unspecified fracture of the lower end of left radius, initial encounter for closed fracture: Secondary | ICD-10-CM | POA: Diagnosis not present

## 2023-07-13 DIAGNOSIS — M25532 Pain in left wrist: Secondary | ICD-10-CM | POA: Diagnosis not present

## 2023-07-13 DIAGNOSIS — M25432 Effusion, left wrist: Secondary | ICD-10-CM | POA: Diagnosis not present

## 2023-07-13 DIAGNOSIS — M25632 Stiffness of left wrist, not elsewhere classified: Secondary | ICD-10-CM | POA: Diagnosis not present

## 2023-07-13 DIAGNOSIS — Z8781 Personal history of (healed) traumatic fracture: Secondary | ICD-10-CM | POA: Diagnosis not present

## 2023-07-20 DIAGNOSIS — M25532 Pain in left wrist: Secondary | ICD-10-CM | POA: Diagnosis not present

## 2023-07-20 DIAGNOSIS — M25432 Effusion, left wrist: Secondary | ICD-10-CM | POA: Diagnosis not present

## 2023-07-20 DIAGNOSIS — Z8781 Personal history of (healed) traumatic fracture: Secondary | ICD-10-CM | POA: Diagnosis not present

## 2023-07-20 DIAGNOSIS — M25632 Stiffness of left wrist, not elsewhere classified: Secondary | ICD-10-CM | POA: Diagnosis not present

## 2023-07-29 DIAGNOSIS — M25532 Pain in left wrist: Secondary | ICD-10-CM | POA: Diagnosis not present

## 2023-07-29 DIAGNOSIS — R531 Weakness: Secondary | ICD-10-CM | POA: Diagnosis not present

## 2023-07-29 DIAGNOSIS — Z9889 Other specified postprocedural states: Secondary | ICD-10-CM | POA: Diagnosis not present

## 2023-07-29 DIAGNOSIS — S52572D Other intraarticular fracture of lower end of left radius, subsequent encounter for closed fracture with routine healing: Secondary | ICD-10-CM | POA: Diagnosis not present

## 2023-07-29 DIAGNOSIS — M25432 Effusion, left wrist: Secondary | ICD-10-CM | POA: Diagnosis not present

## 2023-07-29 DIAGNOSIS — Z789 Other specified health status: Secondary | ICD-10-CM | POA: Diagnosis not present

## 2023-07-29 DIAGNOSIS — X58XXXD Exposure to other specified factors, subsequent encounter: Secondary | ICD-10-CM | POA: Diagnosis not present

## 2023-07-29 DIAGNOSIS — M25632 Stiffness of left wrist, not elsewhere classified: Secondary | ICD-10-CM | POA: Diagnosis not present

## 2023-08-05 DIAGNOSIS — S52572D Other intraarticular fracture of lower end of left radius, subsequent encounter for closed fracture with routine healing: Secondary | ICD-10-CM | POA: Diagnosis not present

## 2023-08-05 DIAGNOSIS — X58XXXD Exposure to other specified factors, subsequent encounter: Secondary | ICD-10-CM | POA: Diagnosis not present

## 2023-08-05 DIAGNOSIS — Z9889 Other specified postprocedural states: Secondary | ICD-10-CM | POA: Diagnosis not present

## 2023-08-05 DIAGNOSIS — M25632 Stiffness of left wrist, not elsewhere classified: Secondary | ICD-10-CM | POA: Diagnosis not present

## 2023-08-05 DIAGNOSIS — R531 Weakness: Secondary | ICD-10-CM | POA: Diagnosis not present

## 2023-08-05 DIAGNOSIS — M25532 Pain in left wrist: Secondary | ICD-10-CM | POA: Diagnosis not present

## 2023-08-05 DIAGNOSIS — M25432 Effusion, left wrist: Secondary | ICD-10-CM | POA: Diagnosis not present

## 2023-08-05 DIAGNOSIS — Z789 Other specified health status: Secondary | ICD-10-CM | POA: Diagnosis not present

## 2023-08-12 DIAGNOSIS — Z789 Other specified health status: Secondary | ICD-10-CM | POA: Diagnosis not present

## 2023-08-12 DIAGNOSIS — R531 Weakness: Secondary | ICD-10-CM | POA: Diagnosis not present

## 2023-08-12 DIAGNOSIS — M25632 Stiffness of left wrist, not elsewhere classified: Secondary | ICD-10-CM | POA: Diagnosis not present

## 2023-08-12 DIAGNOSIS — M25432 Effusion, left wrist: Secondary | ICD-10-CM | POA: Diagnosis not present

## 2023-08-12 DIAGNOSIS — X58XXXD Exposure to other specified factors, subsequent encounter: Secondary | ICD-10-CM | POA: Diagnosis not present

## 2023-08-12 DIAGNOSIS — M25532 Pain in left wrist: Secondary | ICD-10-CM | POA: Diagnosis not present

## 2023-08-12 DIAGNOSIS — S52572D Other intraarticular fracture of lower end of left radius, subsequent encounter for closed fracture with routine healing: Secondary | ICD-10-CM | POA: Diagnosis not present

## 2023-08-12 DIAGNOSIS — Z4789 Encounter for other orthopedic aftercare: Secondary | ICD-10-CM | POA: Diagnosis not present

## 2023-08-12 DIAGNOSIS — Z8781 Personal history of (healed) traumatic fracture: Secondary | ICD-10-CM | POA: Diagnosis not present

## 2023-08-12 DIAGNOSIS — S52502D Unspecified fracture of the lower end of left radius, subsequent encounter for closed fracture with routine healing: Secondary | ICD-10-CM | POA: Diagnosis not present

## 2023-08-12 DIAGNOSIS — Z9889 Other specified postprocedural states: Secondary | ICD-10-CM | POA: Diagnosis not present

## 2023-08-19 DIAGNOSIS — M25532 Pain in left wrist: Secondary | ICD-10-CM | POA: Diagnosis not present

## 2023-08-19 DIAGNOSIS — M25432 Effusion, left wrist: Secondary | ICD-10-CM | POA: Diagnosis not present

## 2023-08-19 DIAGNOSIS — Z789 Other specified health status: Secondary | ICD-10-CM | POA: Diagnosis not present

## 2023-08-19 DIAGNOSIS — R531 Weakness: Secondary | ICD-10-CM | POA: Diagnosis not present

## 2023-08-19 DIAGNOSIS — S52572D Other intraarticular fracture of lower end of left radius, subsequent encounter for closed fracture with routine healing: Secondary | ICD-10-CM | POA: Diagnosis not present

## 2023-08-19 DIAGNOSIS — Z9889 Other specified postprocedural states: Secondary | ICD-10-CM | POA: Diagnosis not present

## 2023-08-19 DIAGNOSIS — X58XXXD Exposure to other specified factors, subsequent encounter: Secondary | ICD-10-CM | POA: Diagnosis not present

## 2023-08-19 DIAGNOSIS — M25632 Stiffness of left wrist, not elsewhere classified: Secondary | ICD-10-CM | POA: Diagnosis not present

## 2023-08-26 DIAGNOSIS — M25532 Pain in left wrist: Secondary | ICD-10-CM | POA: Diagnosis not present

## 2023-08-26 DIAGNOSIS — X58XXXD Exposure to other specified factors, subsequent encounter: Secondary | ICD-10-CM | POA: Diagnosis not present

## 2023-08-26 DIAGNOSIS — Z9889 Other specified postprocedural states: Secondary | ICD-10-CM | POA: Diagnosis not present

## 2023-08-26 DIAGNOSIS — M25432 Effusion, left wrist: Secondary | ICD-10-CM | POA: Diagnosis not present

## 2023-08-26 DIAGNOSIS — S52572D Other intraarticular fracture of lower end of left radius, subsequent encounter for closed fracture with routine healing: Secondary | ICD-10-CM | POA: Diagnosis not present

## 2023-08-26 DIAGNOSIS — Z789 Other specified health status: Secondary | ICD-10-CM | POA: Diagnosis not present

## 2023-08-26 DIAGNOSIS — M25632 Stiffness of left wrist, not elsewhere classified: Secondary | ICD-10-CM | POA: Diagnosis not present

## 2023-08-26 DIAGNOSIS — R531 Weakness: Secondary | ICD-10-CM | POA: Diagnosis not present

## 2023-09-02 DIAGNOSIS — M25632 Stiffness of left wrist, not elsewhere classified: Secondary | ICD-10-CM | POA: Diagnosis not present

## 2023-09-02 DIAGNOSIS — Z789 Other specified health status: Secondary | ICD-10-CM | POA: Diagnosis not present

## 2023-09-02 DIAGNOSIS — X58XXXD Exposure to other specified factors, subsequent encounter: Secondary | ICD-10-CM | POA: Diagnosis not present

## 2023-09-02 DIAGNOSIS — M25532 Pain in left wrist: Secondary | ICD-10-CM | POA: Diagnosis not present

## 2023-09-02 DIAGNOSIS — S52572D Other intraarticular fracture of lower end of left radius, subsequent encounter for closed fracture with routine healing: Secondary | ICD-10-CM | POA: Diagnosis not present

## 2023-09-02 DIAGNOSIS — M25432 Effusion, left wrist: Secondary | ICD-10-CM | POA: Diagnosis not present

## 2023-09-02 DIAGNOSIS — R531 Weakness: Secondary | ICD-10-CM | POA: Diagnosis not present

## 2023-09-02 DIAGNOSIS — Z9889 Other specified postprocedural states: Secondary | ICD-10-CM | POA: Diagnosis not present

## 2023-09-09 DIAGNOSIS — M25632 Stiffness of left wrist, not elsewhere classified: Secondary | ICD-10-CM | POA: Diagnosis not present

## 2023-09-09 DIAGNOSIS — M25432 Effusion, left wrist: Secondary | ICD-10-CM | POA: Diagnosis not present

## 2023-09-09 DIAGNOSIS — S52572D Other intraarticular fracture of lower end of left radius, subsequent encounter for closed fracture with routine healing: Secondary | ICD-10-CM | POA: Diagnosis not present

## 2023-09-09 DIAGNOSIS — Z789 Other specified health status: Secondary | ICD-10-CM | POA: Diagnosis not present

## 2023-09-09 DIAGNOSIS — R531 Weakness: Secondary | ICD-10-CM | POA: Diagnosis not present

## 2023-09-09 DIAGNOSIS — M25532 Pain in left wrist: Secondary | ICD-10-CM | POA: Diagnosis not present

## 2023-09-09 DIAGNOSIS — Z9889 Other specified postprocedural states: Secondary | ICD-10-CM | POA: Diagnosis not present

## 2023-09-09 DIAGNOSIS — X58XXXD Exposure to other specified factors, subsequent encounter: Secondary | ICD-10-CM | POA: Diagnosis not present

## 2023-09-23 DIAGNOSIS — M25532 Pain in left wrist: Secondary | ICD-10-CM | POA: Diagnosis not present

## 2023-09-23 DIAGNOSIS — S52502D Unspecified fracture of the lower end of left radius, subsequent encounter for closed fracture with routine healing: Secondary | ICD-10-CM | POA: Diagnosis not present

## 2023-09-23 DIAGNOSIS — X58XXXD Exposure to other specified factors, subsequent encounter: Secondary | ICD-10-CM | POA: Diagnosis not present

## 2023-09-23 DIAGNOSIS — Z789 Other specified health status: Secondary | ICD-10-CM | POA: Diagnosis not present

## 2023-09-23 DIAGNOSIS — Z9889 Other specified postprocedural states: Secondary | ICD-10-CM | POA: Diagnosis not present

## 2023-09-23 DIAGNOSIS — R531 Weakness: Secondary | ICD-10-CM | POA: Diagnosis not present

## 2023-09-23 DIAGNOSIS — M25632 Stiffness of left wrist, not elsewhere classified: Secondary | ICD-10-CM | POA: Diagnosis not present

## 2023-09-23 DIAGNOSIS — M25432 Effusion, left wrist: Secondary | ICD-10-CM | POA: Diagnosis not present

## 2023-09-23 DIAGNOSIS — Z8781 Personal history of (healed) traumatic fracture: Secondary | ICD-10-CM | POA: Diagnosis not present

## 2023-09-23 DIAGNOSIS — S52572D Other intraarticular fracture of lower end of left radius, subsequent encounter for closed fracture with routine healing: Secondary | ICD-10-CM | POA: Diagnosis not present

## 2023-09-23 DIAGNOSIS — M654 Radial styloid tenosynovitis [de Quervain]: Secondary | ICD-10-CM | POA: Diagnosis not present

## 2023-11-04 DIAGNOSIS — S52572A Other intraarticular fracture of lower end of left radius, initial encounter for closed fracture: Secondary | ICD-10-CM | POA: Diagnosis not present

## 2023-11-04 DIAGNOSIS — M654 Radial styloid tenosynovitis [de Quervain]: Secondary | ICD-10-CM | POA: Diagnosis not present

## 2023-11-18 ENCOUNTER — Ambulatory Visit: Admitting: Internal Medicine

## 2023-11-18 ENCOUNTER — Other Ambulatory Visit: Payer: Self-pay | Admitting: Internal Medicine

## 2023-11-18 ENCOUNTER — Ambulatory Visit: Payer: Self-pay

## 2023-11-18 ENCOUNTER — Encounter: Payer: Self-pay | Admitting: Internal Medicine

## 2023-11-18 VITALS — BP 116/60 | HR 64 | Ht 67.0 in | Wt 183.2 lb

## 2023-11-18 DIAGNOSIS — R3129 Other microscopic hematuria: Secondary | ICD-10-CM | POA: Diagnosis not present

## 2023-11-18 DIAGNOSIS — N3091 Cystitis, unspecified with hematuria: Secondary | ICD-10-CM | POA: Diagnosis not present

## 2023-11-18 LAB — POCT URINALYSIS DIPSTICK
Bilirubin, UA: NEGATIVE
Glucose, UA: NEGATIVE
Ketones, UA: NEGATIVE
Nitrite, UA: NEGATIVE
Protein, UA: POSITIVE — AB
Spec Grav, UA: 1.02 (ref 1.010–1.025)
Urobilinogen, UA: 0.2 U/dL
pH, UA: 6 (ref 5.0–8.0)

## 2023-11-18 MED ORDER — NITROFURANTOIN MONOHYD MACRO 100 MG PO CAPS: 100.0000 mg | ORAL_CAPSULE | Freq: Two times a day (BID) | ORAL | 0 refills | Status: AC

## 2023-11-18 NOTE — Telephone Encounter (Signed)
 Noted  Pt has a appt.  KP

## 2023-11-18 NOTE — Telephone Encounter (Signed)
 FYI Only or Action Required?: FYI only for provider.  Patient was last seen in primary care on 03/27/2023 by Joshua Cathryne BROCKS, MD.  Called Nurse Triage reporting Dysuria.  Symptoms began several days ago.  Interventions attempted: Nothing.  Symptoms are: gradually worsening.  Triage Disposition: See Physician Within 24 Hours  Patient/caregiver understands and will follow disposition?:                   Copied from CRM #[id number]. Topic: Clinical - Red Word Triage >> Nov 18, 2023  8:10 AM Charlet HERO wrote: Red Word that prompted transfer to Nurse Triage: Patient is calling she believes she has uti, she is having lower abdomen pain, frequent urination pain and burning. Reason for Disposition  Urinating more frequently than usual (i.e., frequency) OR new-onset of the feeling of an urgent need to urinate (i.e., urgency)  Answer Assessment - Initial Assessment Questions 1. SYMPTOM: What's the main symptom you're concerned about? (e.g., frequency, incontinence)     Burning, frequency, odor to urine 2. ONSET: When did the  sx  start?     Monday 3. PAIN: Is there any pain? If Yes, ask: How bad is it? (Scale: 1-10; mild, moderate, severe)     Yes with urination 4. CAUSE: What do you think is causing the symptoms?     UUTI 5. OTHER SYMPTOMS: Do you have any other symptoms? (e.g., blood in urine, fever, flank pain, pain with urination)     Pain with uriation  Protocols used: Urinary Symptoms-A-AH

## 2023-11-18 NOTE — Progress Notes (Signed)
 Date:  11/18/2023   Name:  Grace Sanchez   DOB:  20-Nov-1960   MRN:  [id number]   Chief Complaint: Urinary Tract Infection (Patient presents today for dysuria, frequency in urine, and abdominal pressure x 3 days. )  Urinary Tract Infection  This is a new problem. Episode onset: three days. The problem occurs every urination. The problem has been unchanged. The quality of the pain is described as burning. The pain is mild. There has been no fever. Associated symptoms include hematuria. Pertinent negatives include no chills or nausea.    Review of Systems  Constitutional:  Negative for chills, fatigue and fever.  Respiratory:  Negative for chest tightness and shortness of breath.   Cardiovascular:  Negative for chest pain.  Gastrointestinal:  Negative for diarrhea and nausea.  Genitourinary:  Positive for dysuria and hematuria.     Lab Results  Component Value Date   NA 143 09/25/2022   K 4.5 09/25/2022   CO2 26 09/25/2022   GLUCOSE 95 09/25/2022   BUN 21 09/25/2022   CREATININE 0.81 09/25/2022   CALCIUM 9.5 09/25/2022   EGFR 82 09/25/2022   GFRNONAA 87 01/12/2017   Lab Results  Component Value Date   CHOL 248 (H) 03/27/2023   HDL 71 03/27/2023   LDLCALC 162 (H) 03/27/2023   TRIG 88 03/27/2023   Lab Results  Component Value Date   TSH 1.530 08/22/2020   Lab Results  Component Value Date   HGBA1C 5.3 08/22/2020   Lab Results  Component Value Date   WBC 7.6 08/22/2020   HGB 14.9 08/22/2020   HCT 43.7 08/22/2020   MCV 88 08/22/2020   PLT 349 08/22/2020   Lab Results  Component Value Date   ALT 10 03/27/2023   AST 14 09/25/2022   ALKPHOS 53 09/25/2022   BILITOT 0.4 09/25/2022   Lab Results  Component Value Date   VD25OH 26.9 (L) 01/12/2017     Patient Active Problem List   Diagnosis Date Noted   Endometrial cancer (HCC) 07/11/2016   Status post hysterectomy 09/27/2014   Endometrial intraepithelial neoplasia (EIN) 09/06/2014    Allergies  Allergen  Reactions   Codeine Other (See Comments)    Vasovagal response- hypotensive   Penicillins    Sulfa  Antibiotics     Past Surgical History:  Procedure Laterality Date   ABDOMINAL HYSTERECTOMY     CERVICAL POLYPECTOMY     INGUINAL HERNIA REPAIR Left    LAPAROSCOPIC HYSTERECTOMY N/A 09/27/2014   Procedure: HYSTERECTOMY TOTAL LAPAROSCOPIC;  Surgeon: Garnette Mace, MD;  Location: [facility];  Service: Gynecology;  Laterality: N/A;   LAPAROTOMY N/A 09/27/2014   Procedure: LAPAROTOMY;  Surgeon: Prentice Agent, MD;  Location: [facility];  Service: Gynecology;  Laterality: N/A;   LYMPH NODE BIOPSY N/A 09/27/2014   Procedure: LYMPH NODE BIOPSY;  Surgeon: Prentice Agent, MD;  Location: [facility];  Service: Gynecology;  Laterality: N/A;   MYOMECTOMY     PELVIC LYMPH NODE DISSECTION N/A 09/27/2014   Procedure: PELVIC LYMPH NODE DISSECTION;  Surgeon: Prentice Agent, MD;  Location: [facility];  Service: Gynecology;  Laterality: N/A;    Social History   Tobacco Use   Smoking status: Never   Smokeless tobacco: Never   Tobacco comments:    2 cigarettes a week, during college. Smoke for 4 years during college  Vaping Use   Vaping status: Never Used  Substance Use Topics   Alcohol use: Yes    Alcohol/week: 1.0 standard drink  of alcohol    Types: 1 Glasses of wine per week    Comment: glass a day   Drug use: No     Medication list has been reviewed and updated.  Current Meds  Medication Sig   nitrofurantoin , macrocrystal-monohydrate, (MACROBID ) 100 MG capsule Take 1 capsule (100 mg total) by mouth 2 (two) times daily for 7 days.       03/27/2023    9:09 AM 09/25/2022    8:10 AM 03/24/2022   11:04 AM 12/25/2021    8:38 AM  GAD 7 : Generalized Anxiety Score  Nervous, Anxious, on Edge 0 0  0  Control/stop worrying 0 0 0 0  Worry too much - different things 0 0 0 0  Trouble relaxing 0 0 0 0  Restless 0 0 0 0  Easily annoyed or irritable 0 0 0 0  Afraid - awful might happen 0 0 0 0  Total  GAD 7 Score 0 0  0  Anxiety Difficulty Not difficult at all Not difficult at all Not difficult at all Not difficult at all       11/18/2023   10:06 AM 03/27/2023    9:09 AM 09/25/2022    8:10 AM  Depression screen PHQ 2/9  Decreased Interest 0 0 0  Down, Depressed, Hopeless 0 0 0  PHQ - 2 Score 0 0 0  Altered sleeping  0 0  Tired, decreased energy  0 0  Change in appetite  0 0  Feeling bad or failure about yourself   0 0  Trouble concentrating  0 0  Moving slowly or fidgety/restless  0 0  Suicidal thoughts  0 0  PHQ-9 Score  0 0  Difficult doing work/chores  Not difficult at all Not difficult at all    BP Readings from Last 3 Encounters:  11/18/23 116/60  03/27/23 124/72  09/25/22 102/64    Physical Exam Vitals and nursing note reviewed.  Constitutional:      Appearance: Normal appearance. She is well-developed.  Cardiovascular:     Rate and Rhythm: Normal rate and regular rhythm.     Heart sounds: Normal heart sounds.  Pulmonary:     Effort: Pulmonary effort is normal. No respiratory distress.     Breath sounds: Normal breath sounds.  Abdominal:     General: Bowel sounds are normal.     Palpations: Abdomen is soft.     Tenderness: There is abdominal tenderness in the suprapubic area. There is no right CVA tenderness, left CVA tenderness, guarding or rebound.  Musculoskeletal:     Right lower leg: No edema.     Left lower leg: No edema.  Neurological:     Mental Status: She is alert.    Lab Results  Component Value Date   COLORU Yellow 11/18/2023   CLARITYU cloudy 11/18/2023   GLUCOSEUR Negative 11/18/2023   BILIRUBINUR neg 11/18/2023   KETONESU neg 11/18/2023   SPECGRAV 1.020 11/18/2023   RBCUR large 3+ (A) 11/18/2023   PHUR 6.0 11/18/2023   PROTEINUR Positive (A) 11/18/2023   UROBILINOGEN 0.2 11/18/2023   LEUKOCYTESUR Small (1+) (A) 03/27/2023     Wt Readings from Last 3 Encounters:  11/18/23 183 lb 3.2 oz (83.1 kg)  03/27/23 180 lb 9.6 oz (81.9  kg)  09/25/22 179 lb (81.2 kg)    BP 116/60   Pulse 64   Ht 5' 7 (1.702 m)   Wt 183 lb 3.2 oz (83.1 kg)   SpO2 97%  BMI 28.69 kg/m   Assessment and Plan:  Problem List Items Addressed This Visit   None Visit Diagnoses       Cystitis with hematuria    -  Primary   push fluids; can drink cranberry juice will advise when culture returns if antibiotics need to be changed   Relevant Medications   nitrofurantoin , macrocrystal-monohydrate, (MACROBID ) 100 MG capsule   Other Relevant Orders   POCT urinalysis dipstick (Completed)   Urine Culture      She will call back to schedule a TOC/CPX with a new provider.  Return in about 4 months (around 03/19/2024) for CPX.    Leita HILARIO Adie, MD [facility] [place]

## 2023-11-18 NOTE — Progress Notes (Unsigned)
 Date:  11/18/2023   Name:  Grace Sanchez   DOB:  May 29, 1960   MRN:  [id number]   Chief Complaint: No chief complaint on file.  HPI  Review of Systems   Lab Results  Component Value Date   NA 143 09/25/2022   K 4.5 09/25/2022   CO2 26 09/25/2022   GLUCOSE 95 09/25/2022   BUN 21 09/25/2022   CREATININE 0.81 09/25/2022   CALCIUM 9.5 09/25/2022   EGFR 82 09/25/2022   GFRNONAA 87 01/12/2017   Lab Results  Component Value Date   CHOL 248 (H) 03/27/2023   HDL 71 03/27/2023   LDLCALC 162 (H) 03/27/2023   TRIG 88 03/27/2023   Lab Results  Component Value Date   TSH 1.530 08/22/2020   Lab Results  Component Value Date   HGBA1C 5.3 08/22/2020   Lab Results  Component Value Date   WBC 7.6 08/22/2020   HGB 14.9 08/22/2020   HCT 43.7 08/22/2020   MCV 88 08/22/2020   PLT 349 08/22/2020   Lab Results  Component Value Date   ALT 10 03/27/2023   AST 14 09/25/2022   ALKPHOS 53 09/25/2022   BILITOT 0.4 09/25/2022   Lab Results  Component Value Date   VD25OH 26.9 (L) 01/12/2017     Patient Active Problem List   Diagnosis Date Noted   Endometrial cancer (HCC) 07/11/2016   Status post hysterectomy 09/27/2014   Endometrial intraepithelial neoplasia (EIN) 09/06/2014    Allergies  Allergen Reactions   Codeine Other (See Comments)    Vasovagal response- hypotensive   Penicillins    Sulfa  Antibiotics     Past Surgical History:  Procedure Laterality Date   ABDOMINAL HYSTERECTOMY     CERVICAL POLYPECTOMY     INGUINAL HERNIA REPAIR Left    LAPAROSCOPIC HYSTERECTOMY N/A 09/27/2014   Procedure: HYSTERECTOMY TOTAL LAPAROSCOPIC;  Surgeon: Garnette Mace, MD;  Location: [facility];  Service: Gynecology;  Laterality: N/A;   LAPAROTOMY N/A 09/27/2014   Procedure: LAPAROTOMY;  Surgeon: Prentice Agent, MD;  Location: [facility];  Service: Gynecology;  Laterality: N/A;   LYMPH NODE BIOPSY N/A 09/27/2014   Procedure: LYMPH NODE BIOPSY;  Surgeon: Prentice Agent, MD;  Location:  [facility];  Service: Gynecology;  Laterality: N/A;   MYOMECTOMY     PELVIC LYMPH NODE DISSECTION N/A 09/27/2014   Procedure: PELVIC LYMPH NODE DISSECTION;  Surgeon: Prentice Agent, MD;  Location: [facility];  Service: Gynecology;  Laterality: N/A;    Social History   Tobacco Use   Smoking status: Never   Smokeless tobacco: Never   Tobacco comments:    2 cigarettes a week, during college. Smoke for 4 years during college  Vaping Use   Vaping status: Never Used  Substance Use Topics   Alcohol use: Yes    Alcohol/week: 1.0 standard drink of alcohol    Types: 1 Glasses of wine per week    Comment: glass a day   Drug use: No     Medication list has been reviewed and updated.  No outpatient medications have been marked as taking for the 11/18/23 encounter (Orders Only) with Justus Leita DEL, MD.       03/27/2023    9:09 AM 09/25/2022    8:10 AM 03/24/2022   11:04 AM 12/25/2021    8:38 AM  GAD 7 : Generalized Anxiety Score  Nervous, Anxious, on Edge 0 0  0  Control/stop worrying 0 0 0 0  Worry too much -  different things 0 0 0 0  Trouble relaxing 0 0 0 0  Restless 0 0 0 0  Easily annoyed or irritable 0 0 0 0  Afraid - awful might happen 0 0 0 0  Total GAD 7 Score 0 0  0  Anxiety Difficulty Not difficult at all Not difficult at all Not difficult at all Not difficult at all       03/27/2023    9:09 AM 09/25/2022    8:10 AM 03/24/2022   11:04 AM  Depression screen PHQ 2/9  Decreased Interest 0 0 0  Down, Depressed, Hopeless 0 0 0  PHQ - 2 Score 0 0 0  Altered sleeping 0 0 0  Tired, decreased energy 0 0 0  Change in appetite 0 0 0  Feeling bad or failure about yourself  0 0 0  Trouble concentrating 0 0 0  Moving slowly or fidgety/restless 0 0 0  Suicidal thoughts 0 0 0  PHQ-9 Score 0 0 0  Difficult doing work/chores Not difficult at all Not difficult at all Not difficult at all    BP Readings from Last 3 Encounters:  03/27/23 124/72  09/25/22 102/64  03/24/22  120/70    Physical Exam  Wt Readings from Last 3 Encounters:  03/27/23 180 lb 9.6 oz (81.9 kg)  09/25/22 179 lb (81.2 kg)  03/24/22 179 lb (81.2 kg)    There were no vitals taken for this visit.  Assessment and Plan:  Problem List Items Addressed This Visit   None   No follow-ups on file.    Leita HILARIO Adie, MD [facility] [place]

## 2023-11-20 ENCOUNTER — Ambulatory Visit: Payer: Self-pay | Admitting: Internal Medicine

## 2023-11-20 LAB — URINE CULTURE

## 2024-02-09 ENCOUNTER — Ambulatory Visit: Payer: Self-pay

## 2024-02-09 NOTE — Telephone Encounter (Signed)
 FYI

## 2024-02-09 NOTE — Telephone Encounter (Signed)
 FYI Only or Action Required?: FYI only for provider: appointment scheduled on 11/5.  Patient was last seen in primary care on 11/18/2023 by Justus Leita DEL, MD.  Called Nurse Triage reporting Numbness.  Symptoms began several days ago.  Interventions attempted: Rest, hydration, or home remedies.  Symptoms are: gradually worsening.  Triage Disposition: See HCP Within 4 Hours (Or PCP Triage)  Patient/caregiver understands and will follow disposition?: Yes  Message from Kendralyn S sent at 02/09/2024  3:01 PM EST  Reason for Triage: has tingling along neck and behind ear since Friday, suspects shingles   Reason for Disposition  [1] Tingling (e.g., pins and needles) of the face, arm / hand, or leg / foot on one side of the body AND [2] present now  (Exceptions: Chronic/recurrent symptom lasting > 4 weeks; or from known cause, such as: bumped elbow, carpal tunnel, pinched nerve.)  Answer Assessment - Initial Assessment Questions Starting Friday- Tingling around where her glasses rest on her left ear , top of left earlobe, and above her ear about an inch and a half and down her neck to about 4 fingers width above collarbone. Husband inspected and it is Red under her hair and feels raised. Some dull pain but not terrible 1/10- almost like a sunburn. Has not had shingles vaccine. Slight headache is the only other sx.  Former Dr Joshua patient- Acute visit scheduled in office to assess. ED/UC precautions given and understood.   1. SYMPTOM: What is the main symptom you are concerned about? (e.g., weakness, numbness)     Tingling left ear 2. ONSET: When did this start? (e.g., minutes, hours, days; while sleeping)     Friday- tingling started  3. LAST NORMAL: When was the last time you (the patient) were normal (no symptoms)?     Friday  4. PATTERN Does this come and go, or has it been constant since it started?  Is it present now?     constant 5. CARDIAC SYMPTOMS: Have you had any  of the following symptoms: chest pain, difficulty breathing, palpitations?     Denies  6. NEUROLOGIC SYMPTOMS: Have you had any of the following symptoms: headache, dizziness, vision loss, double vision, changes in speech, unsteady on your feet?     Light headache 7. OTHER SYMPTOMS: Do you have any other symptoms?     Light headache  Protocols used: Neurologic Deficit-A-AH

## 2024-02-10 ENCOUNTER — Ambulatory Visit: Admitting: Student

## 2024-02-10 ENCOUNTER — Encounter: Payer: Self-pay | Admitting: Student

## 2024-02-10 VITALS — BP 116/62 | HR 65 | Temp 97.9°F | Ht 67.0 in | Wt 188.0 lb

## 2024-02-10 DIAGNOSIS — B029 Zoster without complications: Secondary | ICD-10-CM | POA: Diagnosis not present

## 2024-02-10 MED ORDER — VALACYCLOVIR HCL 1 G PO TABS: 1000.0000 mg | ORAL_TABLET | Freq: Two times a day (BID) | ORAL | 0 refills | Status: AC

## 2024-02-10 NOTE — Progress Notes (Signed)
 Established Patient Office Visit  Subjective   Patient ID: Grace Sanchez, female    DOB: 19-Jul-1960  Age: 63 y.o. MRN: [id number]  Chief Complaint  Patient presents with   Ear Pain    Left ear pain, burning from her scalp to her neck, started Friday, tingling behind her ear     Grace Sanchez with medical hx listed below presents today for burning behind the left ear. Radiates to the neck and head. Pricking sensation that has gradually worsened. Feels some bumps on on scalp with possible drainage but unable to see through her hair. Skin is sensitive to the touch. She denies fever, chills, tinnitus, facial droop, numbness, tingling, blurred vision, jaw pain, eye pain, or tearing. Has not had shingles vaccine.  Patient Active Problem List   Diagnosis Date Noted   Endometrial cancer (HCC) 07/11/2016   Status post hysterectomy 09/27/2014   Endometrial intraepithelial neoplasia (EIN) 09/06/2014      ROS Refer to HPI    Objective:     Outpatient Encounter Medications as of 02/10/2024  Medication Sig   valACYclovir (VALTREX) 1000 MG tablet Take 1 tablet (1,000 mg total) by mouth 2 (two) times daily for 7 days.   No facility-administered encounter medications on file as of 02/10/2024.    BP 116/62   Pulse 65   Temp 97.9 F (36.6 C) (Oral)   Ht 5' 7 (1.702 m)   Wt 188 lb (85.3 kg)   SpO2 99%   BMI 29.44 kg/m  BP Readings from Last 3 Encounters:  02/10/24 116/62  11/18/23 116/60  03/27/23 124/72    Physical Exam Constitutional:      Appearance: Normal appearance.  HENT:     Right Ear: Tympanic membrane and ear canal normal.     Left Ear: Tympanic membrane and ear canal normal.     Ears:     Comments: No rash of the auricle    Mouth/Throat:     Mouth: Mucous membranes are moist.     Pharynx: Oropharynx is clear.  Eyes:     General:        Right eye: No discharge.        Left eye: No discharge.     Extraocular Movements: Extraocular movements intact.      Conjunctiva/sclera: Conjunctivae normal.     Pupils: Pupils are equal, round, and reactive to light.  Cardiovascular:     Rate and Rhythm: Normal rate.  Pulmonary:     Effort: Pulmonary effort is normal. No respiratory distress.     Breath sounds: No rhonchi or rales.  Skin:    Capillary Refill: Capillary refill takes less than 2 seconds.     Comments: Redness behind the left ear and temporal area, unable to visualize vesicles due to hair  Neurological:     General: No focal deficit present.     Mental Status: She is alert and oriented to person, place, and time.     Comments: No facial droop  Psychiatric:        Mood and Affect: Mood normal.        Behavior: Behavior normal.        02/10/2024    8:40 AM 11/18/2023   10:06 AM 03/27/2023    9:09 AM  Depression screen PHQ 2/9  Decreased Interest 0 0 0  Down, Depressed, Hopeless 0 0 0  PHQ - 2 Score 0 0 0  Altered sleeping   0  Tired, decreased energy  0  Change in appetite   0  Feeling bad or failure about yourself    0  Trouble concentrating   0  Moving slowly or fidgety/restless   0  Suicidal thoughts   0  PHQ-9 Score   0  Difficult doing work/chores   Not difficult at all       02/10/2024    8:40 AM 03/27/2023    9:09 AM 09/25/2022    8:10 AM 03/24/2022   11:04 AM  GAD 7 : Generalized Anxiety Score  Nervous, Anxious, on Edge 0 0 0   Control/stop worrying 0 0 0 0  Worry too much - different things  0 0 0  Trouble relaxing  0 0 0  Restless  0 0 0  Easily annoyed or irritable  0 0 0  Afraid - awful might happen  0 0 0  Total GAD 7 Score  0 0   Anxiety Difficulty  Not difficult at all Not difficult at all Not difficult at all    No results found for any visits on 02/10/24.    The 10-year ASCVD risk score (Arnett DK, et al., 2019) is: 3.7%    Assessment & Plan:  Herpes zoster without complication Rash and burning pain behind the left ear, likely due to shingles, no ocular or otic symptoms to suggest ramsay  hunt or  ocular disease. ED precautions given as she has upcoming trip for visual changes, eye pain, facial paralysis, fever, conjunctivitis, changes in hearing or tearing.   Other orders -     valACYclovir HCl; Take 1 tablet (1,000 mg total) by mouth 2 (two) times daily for 7 days.  Dispense: 14 tablet; Refill: 0     Return in about 4 weeks (around 03/09/2024) for TOC.    Harlene Saddler, MD

## 2024-04-12 ENCOUNTER — Ambulatory Visit: Admitting: Student

## 2024-04-12 ENCOUNTER — Encounter: Payer: Self-pay | Admitting: Student

## 2024-04-12 VITALS — BP 116/74 | HR 62 | Ht 67.0 in | Wt 191.0 lb

## 2024-04-12 DIAGNOSIS — Z23 Encounter for immunization: Secondary | ICD-10-CM | POA: Diagnosis not present

## 2024-04-12 DIAGNOSIS — E782 Mixed hyperlipidemia: Secondary | ICD-10-CM | POA: Diagnosis not present

## 2024-04-12 DIAGNOSIS — R3121 Asymptomatic microscopic hematuria: Secondary | ICD-10-CM

## 2024-04-12 NOTE — Progress Notes (Signed)
 "  Established Patient Office Visit  Subjective   Patient ID: Grace Sanchez, female    DOB: [birth year]  Age: 64 y.o. MRN: [id number]  Chief Complaint  Patient presents with   Establish Care    Grace Sanchez is a 64 y.o. person with medical hx listed below who presents today for transfer of care.  She is feeling well without acute complaints today.  Previously seeing Dr. Joshua as PCP who recently retired.  Patient Active Problem List   Diagnosis Date Noted   Hyperlipidemia 04/18/2024   Hematuria 04/18/2024   Suspected endometrial cancer 07/11/2016   Status post hysterectomy 09/27/2014      ROS Refer to HPI    Objective:     No outpatient encounter medications on file as of 04/12/2024.   No facility-administered encounter medications on file as of 04/12/2024.    BP 116/74   Pulse 62   Ht 5' 7 (1.702 m)   Wt 191 lb (86.6 kg)   SpO2 97%   BMI 29.91 kg/m  BP Readings from Last 3 Encounters:  04/12/24 116/74  02/10/24 116/62  11/18/23 116/60    Physical Exam Constitutional:      Appearance: Normal appearance.  HENT:     Mouth/Throat:     Mouth: Mucous membranes are moist.     Pharynx: Oropharynx is clear.  Cardiovascular:     Rate and Rhythm: Normal rate and regular rhythm.     Pulses: Normal pulses.     Heart sounds: Normal heart sounds.  Pulmonary:     Effort: Pulmonary effort is normal.     Breath sounds: No rhonchi or rales.  Abdominal:     General: Abdomen is flat. Bowel sounds are normal. There is no distension.     Palpations: Abdomen is soft.     Tenderness: There is no abdominal tenderness.  Musculoskeletal:        General: Normal range of motion.     Right lower leg: No edema.     Left lower leg: No edema.  Skin:    General: Skin is warm and dry.     Capillary Refill: Capillary refill takes less than 2 seconds.  Neurological:     General: No focal deficit present.     Mental Status: She is alert and oriented to person, place, and time.   Psychiatric:        Mood and Affect: Mood normal.        Behavior: Behavior normal.        04/12/2024    1:53 PM 02/10/2024    8:40 AM 11/18/2023   10:06 AM  Depression screen PHQ 2/9  Decreased Interest 0 0 0  Down, Depressed, Hopeless 0 0 0  PHQ - 2 Score 0 0 0       04/12/2024    1:53 PM 02/10/2024    8:40 AM 03/27/2023    9:09 AM 09/25/2022    8:10 AM  GAD 7 : Generalized Anxiety Score  Nervous, Anxious, on Edge 0 0 0 0  Control/stop worrying 0 0 0 0  Worry too much - different things   0 0  Trouble relaxing   0 0  Restless   0 0  Easily annoyed or irritable   0 0  Afraid - awful might happen   0 0  Total GAD 7 Score   0 0  Anxiety Difficulty   Not difficult at all Not difficult at all    Results  for orders placed or performed in visit on 04/12/24  Lipid Profile  Result Value Ref Range   Cholesterol, Total 237 (H) 100 - 199 mg/dL   Triglycerides 869 0 - 149 mg/dL   HDL 75 >60 mg/dL   VLDL Cholesterol Cal 23 5 - 40 mg/dL   LDL Chol Calc (NIH) 860 (H) 0 - 99 mg/dL   Chol/HDL Ratio 3.2 0.0 - 4.4 ratio      The 10-year ASCVD risk score (Arnett DK, et al., 2019) is: 3.5%    Assessment & Plan:  Encounter for immunization -     Flu vaccine trivalent PF, 6mos and older(Flulaval,Afluria,Fluarix,Fluzone)  Moderate mixed hyperlipidemia not requiring statin therapy Assessment & Plan: The 10-year ASCVD risk score (Arnett DK, et al., 2019) is: 3.5%. Lipid panel today.   Orders: -     Lipid panel  Asymptomatic microscopic hematuria Assessment & Plan: Multiple UA with RBCs usually in setting of UTI.  No gross hematuria at this time.  Will obtain UA at next visit if asymptomatic to assess for persistent microscopic hematuria.      Return in about 6 months (around 10/10/2024).    Harlene Saddler, MD "

## 2024-04-13 ENCOUNTER — Ambulatory Visit: Payer: Self-pay | Admitting: Student

## 2024-04-13 LAB — LIPID PANEL
Chol/HDL Ratio: 3.2 ratio (ref 0.0–4.4)
Cholesterol, Total: 237 mg/dL — ABNORMAL HIGH (ref 100–199)
HDL: 75 mg/dL
LDL Chol Calc (NIH): 139 mg/dL — ABNORMAL HIGH (ref 0–99)
Triglycerides: 130 mg/dL (ref 0–149)
VLDL Cholesterol Cal: 23 mg/dL (ref 5–40)

## 2024-04-18 DIAGNOSIS — R319 Hematuria, unspecified: Secondary | ICD-10-CM | POA: Insufficient documentation

## 2024-04-18 DIAGNOSIS — E785 Hyperlipidemia, unspecified: Secondary | ICD-10-CM | POA: Insufficient documentation

## 2024-04-18 NOTE — Assessment & Plan Note (Signed)
 The 10-year ASCVD risk score (Arnett DK, et al., 2019) is: 3.5%. Lipid panel today.

## 2024-04-18 NOTE — Assessment & Plan Note (Signed)
 Multiple UA with RBCs usually in setting of UTI.  No gross hematuria at this time.  Will obtain UA at next visit if asymptomatic to assess for persistent microscopic hematuria.

## 2024-10-11 ENCOUNTER — Encounter: Admitting: Student
